# Patient Record
Sex: Male | Born: 1980 | Race: White | Hispanic: No | Marital: Single | Smoking: Never smoker
Health system: Southern US, Community
[De-identification: ages and names within clinical notes are randomized; demographics above are authoritative.]

## PROBLEM LIST (undated history)

## (undated) DIAGNOSIS — F909 Attention-deficit hyperactivity disorder, unspecified type: Secondary | ICD-10-CM

## (undated) DIAGNOSIS — R569 Unspecified convulsions: Secondary | ICD-10-CM

## (undated) DIAGNOSIS — L709 Acne, unspecified: Secondary | ICD-10-CM

## (undated) DIAGNOSIS — F419 Anxiety disorder, unspecified: Secondary | ICD-10-CM

## (undated) DIAGNOSIS — F79 Unspecified intellectual disabilities: Secondary | ICD-10-CM

## (undated) DIAGNOSIS — E559 Vitamin D deficiency, unspecified: Secondary | ICD-10-CM

## (undated) DIAGNOSIS — J302 Other seasonal allergic rhinitis: Secondary | ICD-10-CM

## (undated) DIAGNOSIS — F840 Autistic disorder: Secondary | ICD-10-CM

---
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
## 2006-03-22 ENCOUNTER — Emergency Department (HOSPITAL_COMMUNITY): Admission: EM | Admit: 2006-03-22 | Discharge: 2006-03-22 | Payer: Self-pay | Admitting: Emergency Medicine

## 2007-05-03 ENCOUNTER — Inpatient Hospital Stay (HOSPITAL_COMMUNITY): Admission: EM | Admit: 2007-05-03 | Discharge: 2007-05-05 | Payer: Self-pay | Admitting: Emergency Medicine

## 2011-02-03 NOTE — H&P (Signed)
[name], [name]                ACCOUNT NO.:  [id number]   MEDICAL RECORD NO.:  [id number]          PATIENT TYPE:  INP   LOCATION:  1402                         FACILITY:  [facility]   PHYSICIAN:  Isidor Holts, M.D.  DATE OF BIRTH:  [birth year]   DATE OF ADMISSION:  05/03/2007  DATE OF DISCHARGE:                              HISTORY & PHYSICAL   PRIMARY CARE PHYSICIAN:  Teena Irani. Arlyce Dice, M.D.   CHIEF COMPLAINT:  Seizure episode and fall;  also bumped forehead.   HISTORY OF PRESENT ILLNESS:  This is a 30 year old male.  For past  medical history, see below.  The patient is a resident of [facility] and was unable to furnish history.  However, an attendant  from the group home was in the emergency department, and states that  earlier in the day today, the patient had a seizure episode, fell,  bumped his forehead.  He was subsequently brought to the emergency  department.  Unfortunately, observer accounts are not available and the  patient is unable to contribute history because of autism.  Head CT scan  was done, which suggested possible cerebral contusion.  However,  followup MRI showed no acute pathology.  The patient was, therefore,  referred to the medical service.   PAST MEDICAL HISTORY:  1. Attention deficit disorder.  2. Autism.  3. Mental retardation.  4. Query seizure disorder.   MEDICATIONS:  1. Strattera 40 mg p.o. q.a.m. and 20 mg p.o. at 9 p.m.  2. Paroxetine 20 mg p.o. daily.  3. Minocycline 100 mg p.o. daily.  4. Chlorpheniramine SR 12 mg p.o. b.i.d.  5. Nexium 40 mg p.o. daily.  6. Lorazepam 0.5 mg p.o. q.i.d. and 2 mg p.o. p.r.n.Marland Kitchen   ALLERGIES:  NO KNOWN DRUG ALLERGIES.   REVIEW OF SYSTEMS:  Unobtainable.  However, no report of abdominal pain,  vomiting, diarrhea, fever, chills, cough or shortness of breath.   SOCIAL HISTORY:  The patient is a resident of [facility] where  he has resided for years.  Nonsmoker, nondrinker.  No history of  drug  abuse.   FAMILY HISTORY:  Noncontributory.   PHYSICAL EXAMINATION:  VITAL SIGNS:  Temperature 98.1, pulse 91-119 per  minute and regular.  Respiratory rate 18, BP 120/30, pulse oximeter 99%  on room air.  GENERAL:  The patient does not appear to be in obvious acute discomfort  at the time of this evaluation.  Alert, noncommunicative, not short of  breath at rest.  Responds to simple commands.  HEENT:  The patient has a small bruise in the left supraorbital region.  No associated underlying hematoma.  No clinical pallor or jaundice.  No  conjunctival injection.  Throat is clear.  NECK:  Supple.  JVP not seen.  No palpable lymphadenopathy.  No palpable  goiter.  No carotid bruits.  CHEST:  Clinically clear to auscultation.  No wheezes or crackles.  CARDIAC:  Heart sounds S1 and S2 heard normal, regular, no murmurs.  ABDOMEN:  Abdomen is flat, soft and nontender.  No palpable  organomegaly.  No palpable masses.  Normal bowel sounds.  EXTREMITIES:  Lower extremity examination shows no pitting edema,  palpable peripheral pulses.  MUSCULOSKELETAL:  Unremarkable.  CENTRAL NERVOUS SYSTEM:  No focal neurologic deficit on gross  examination.   INVESTIGATIONS:  Electrolytes:  Sodium 141, potassium 3.8, chloride 103,  CO2 30, BUN 5, creatinine 0.74, glucose 112.  Head CT scan dated May 03, 2007 showed small focus of high activation right temporal lobe,  query small hemorrhagic contusion.  MRI was recommended.  Brain MRI  dated May 03, 2007 showed no focal or acute abnormality.   ASSESSMENT AND PLAN:  1. Seizure episode.  The patient has not had any documented seizures      for several months to years, according to group home attendant who      was present in the emergency department.  Certainly review of the      patient's medications indicate no seizure medication at present, so      the diagnosis of seizure disorder appears doubtful.  Unfortunately,      we have no observer  account, and must conclude that this was a      single seizure episode.  We shall admit the patient, observe,      institute seizure and fall precautions, do neuro checks q. 6 a.m.      and do Dilantin and Valproic acid levels just in case the patient      was indeed on seizure medication which were not listed.  The      patient is currently on Strattera.  One of the side effects is      seizures.  We shall, therefore, reduce the patient's Strattera for      now; i.e. hold the nocturnal dose of Strattera and continue only a      40 mg p.o. q.a.m.   1. Blunt head trauma.  This is secondary to fall.  Patient reportedly      bumped his left forehead, but there is no obvious laceration.  He      does have a small bruise in the left supraorbital region.  Head CT      scan suggested contusion, but this was not confirmed on brain MRI.      We shall, therefore, observe only for now and utilize p.r.n.      Tylenol.   1. History of attention deficit disorder.  We shall continue a.m.      Strattera as mentioned above, and hold p.m. Strattera for now.   Note we shall request neurology consult in a.m. of May 04, 2007.   Further management will depend on clinical course.      Isidor Holts, M.D.  Electronically Signed    CO/MEDQ  D:  05/03/2007  T:  05/05/2007  Job:  [id number]   cc:   Teena Irani. Arlyce Dice, M.D.  Fax: [phone]

## 2011-02-06 NOTE — Discharge Summary (Signed)
[name], [name]                ACCOUNT NO.:  [id number]   MEDICAL RECORD NO.:  [id number]          PATIENT TYPE:  INP   LOCATION:  1402                         FACILITY:  [facility]   PHYSICIAN:  Lonia Blood, M.D.      DATE OF BIRTH:  02-20-1981   DATE OF ADMISSION:  05/03/2007  DATE OF DISCHARGE:  05/05/2007                               DISCHARGE SUMMARY   PRIMARY CARE PHYSICIAN:  The patient was unassigned to [place].   DISCHARGE DIAGNOSES:  1. Questionable seizure disorder.  2. Mental retardations.  3. Attention deficit disorder.  4. History of head trauma.Marland Kitchen   DISCHARGE MEDICATIONS:  1. Strattera 40 mg daily.  2. Paroxetine 20 mg daily.  3. Minocycline 100 mg daily.  4. Chlorpheniramine 12 mg twice a day.  5. Nexium 40 mg daily.  6. Strattera again 20 mg in the evening.  7. Ativan 2 mg as needed and Ativan 0.5 mg 4 times a day.   DISPOSITION:  The patient was discharged back to a skilled facility with  no change in his medication. No seizure observed in hospital.   PROCEDURES PERFORMED:  None.   BRIEF HISTORY AND PHYSICAL:  Please refer to dictated history and  physical by Dr. Isidor Holts. In short however, this is a 30 year old  gentleman with known history of autism, ADD and mental retardation and  was brought in secondary to patient having a seizure episode.  He had  no prior history of seizure disorder and was admitted mainly for  observation and further treatment.   HOSPITAL COURSE:  Possible seizure.  The patient was admitted, observed  closely in the hospital and no seizure episode was observed.  He was  continued on his home regimen of medications.  Throughout the 2 days in  the hospital and at his baseline.  Subsequently it was not clear what  really happened, whether the patient had a true seizure disorder or not.  His primary care physician is Dr. Dara Hoyer of note.  Hence, we will  communicate with  Dr. Arlyce Dice that further management should be done from  the group home.  In case the patient showed any more signs of seizure then will start  some antiseizure medications.  Otherwise the rest of his medical issues  are stable and no further treatment was required.      Lonia Blood, M.D.  Electronically Signed     LG/MEDQ  D:  06/08/2007  T:  06/08/2007  Job:  [id number]

## 2011-03-03 ENCOUNTER — Emergency Department (HOSPITAL_BASED_OUTPATIENT_CLINIC_OR_DEPARTMENT_OTHER)
Admission: EM | Admit: 2011-03-03 | Discharge: 2011-03-03 | Disposition: A | Discharge status: Home / Self Care | Payer: Medicaid Other | Attending: Emergency Medicine | Admitting: Emergency Medicine

## 2011-03-03 DIAGNOSIS — S0180XA Unspecified open wound of other part of head, initial encounter: Secondary | ICD-10-CM | POA: Insufficient documentation

## 2011-03-03 DIAGNOSIS — F988 Other specified behavioral and emotional disorders with onset usually occurring in childhood and adolescence: Secondary | ICD-10-CM | POA: Insufficient documentation

## 2011-03-03 DIAGNOSIS — Y9210 Unspecified residential institution as the place of occurrence of the external cause: Secondary | ICD-10-CM | POA: Insufficient documentation

## 2011-03-03 DIAGNOSIS — F79 Unspecified intellectual disabilities: Secondary | ICD-10-CM | POA: Insufficient documentation

## 2011-03-03 DIAGNOSIS — X58XXXA Exposure to other specified factors, initial encounter: Secondary | ICD-10-CM | POA: Insufficient documentation

## 2011-06-03 DIAGNOSIS — F419 Anxiety disorder, unspecified: Secondary | ICD-10-CM | POA: Insufficient documentation

## 2011-06-03 DIAGNOSIS — Z9109 Other allergy status, other than to drugs and biological substances: Secondary | ICD-10-CM | POA: Insufficient documentation

## 2011-07-06 LAB — BASIC METABOLIC PANEL
BUN: 5 — ABNORMAL LOW
CO2: 29
CO2: 30
Calcium: 9
Calcium: 9.4
Chloride: 103
Chloride: 105
Creatinine, Ser: 0.74
GFR calc Af Amer: >60
GFR calc Af Amer: >60
GFR calc non Af Amer: >60
Glucose, Bld: 112 — ABNORMAL HIGH
Potassium: 3.8
Sodium: 140
Sodium: 141

## 2011-07-06 LAB — PHENYTOIN LEVEL, TOTAL: Phenytoin Lvl: <2.5 — ABNORMAL LOW

## 2011-07-06 LAB — CBC
Hemoglobin: 15.5
MCHC: 34.3
RBC: 5
WBC: 7.4

## 2011-07-06 LAB — VALPROIC ACID LEVEL: Valproic Acid Lvl: <10 — ABNORMAL LOW

## 2013-07-05 ENCOUNTER — Other Ambulatory Visit: Payer: Self-pay | Admitting: Dentistry

## 2013-07-10 ENCOUNTER — Encounter (HOSPITAL_COMMUNITY): Payer: Self-pay | Admitting: Pharmacy Technician

## 2013-07-12 ENCOUNTER — Encounter (HOSPITAL_COMMUNITY)
Admission: RE | Admit: 2013-07-12 | Discharge: 2013-07-12 | Disposition: A | Discharge status: Home / Self Care | Payer: Medicaid Other | Source: Ambulatory Visit | Attending: Dentistry | Admitting: Dentistry

## 2013-07-12 ENCOUNTER — Encounter (HOSPITAL_COMMUNITY): Payer: Self-pay

## 2013-07-12 DIAGNOSIS — Z01818 Encounter for other preprocedural examination: Secondary | ICD-10-CM | POA: Insufficient documentation

## 2013-07-12 HISTORY — DX: Autistic disorder: F84.0

## 2013-07-12 HISTORY — DX: Unspecified intellectual disabilities: F79

## 2013-07-12 HISTORY — DX: Anxiety disorder, unspecified: F41.9

## 2013-07-12 HISTORY — DX: Attention-deficit hyperactivity disorder, unspecified type: F90.9

## 2013-07-12 NOTE — Pre-Procedure Instructions (Signed)
Blue Winther  07/12/2013   Your procedure is scheduled on:  Tuesday, July 18, 2013  Report to [facility] Short Stay (use Main Entrance "A'') at 7:30AM.  Call this number if you have problems the morning of surgery: [phone]   Remember:   Do not eat food or drink liquids after midnight.   Take these medicines the morning of surgery with A SIP OF WATER:atomoxetine (STRATTERA) 40 MG capsule, LORazepam (ATIVAN), paroxetine mesylate (PEXEVA) 20 MG tablet   Do not wear jewelry.  Do not wear lotions, powders, or colognes. You may wear deodorant.  Men may shave face and neck.  Do not bring valuables to the hospital.  [facility] is not responsible  for any belongings or valuables.               Contacts, dentures or bridgework may not be worn into surgery.  Leave suitcase in the car. After surgery it may be brought to your room.  For patients admitted to the hospital, discharge time is determined by your treatment team.               Patients discharged the day of surgery will not be allowed to drive home.  Name and phone number of your driver:   Special Instructions: Shower using CHG 2 nights before surgery and the night before surgery.  If you shower the day of surgery use CHG.  Use special wash - you have one bottle of CHG for all showers.  You should use approximately 1/3 of the bottle for each shower.   Please read over the following fact sheets that you were given: Pain Booklet and Surgical Site Infection Prevention

## 2013-07-12 NOTE — Progress Notes (Signed)
Nurse called and spoke with [name] at Dr. Janyce Llanos office regarding the consent form. Nurse informed her that it was a consent form in EPIC however it had already been released and Nurse was unable to find consent. [name] stated she already placed orders in [facility] and that the patient should have a consent form and a H&P with them at PAT. Will check with patient.

## 2013-07-12 NOTE — Progress Notes (Signed)
Patient is from [facility] ([facility] [phone]) in [place] and was assisted today by Lysbeth Penner. Lysbeth Penner was unable to provide Nurse with any medical history on patient. Nurse called patients mother Ivory Broad at [phone] and questioned her about any past surgical history and she stated that patient had surgery on his arm after falling off a bed, however she was unsure which arm he had surgery on. When Nurse asked Ivory Broad if she had guardian ship paperwork she stated she had it but was unsure as to where it was. She said patient had a case Production designer, theatre/television/film at [facility] in [place] however she does not know the name of the case manager as she has not been in contact with them in years. Nurse also called [employer] and requested that guardianship papers be faxed to PAT. Nurse also called  Chales Abrahams at Dr. Cathlean Marseilles office [phone] and questioned if she had any guardian ship papers on file. She stated she would check and fax them if she did. Revonda Standard, [place] consulted and made aware of situation and stated no lab work would be needed at this time.

## 2013-07-12 NOTE — Progress Notes (Signed)
Guardian ship papers placed on chart.

## 2013-07-13 NOTE — Progress Notes (Signed)
Anesthesia Chart Review:  Patient is a 32 year old male scheduled for dental restoration, xrays, extractions on 07/18/13 by Dr. Martie Round.  History includes ADHD, anxiety, autism, mental retardation, non-smoker.  He resides at [facility] in [place].  Based on his history, I did not think that he would need EKG, CXR, or labs preoperatively.  He will be further evaluated by his assigned anesthesiologist on the day of surgery.  If labs felt warranted at that time, then a Hemocue or ISTAT can be considered.  Velna Ochs [facility] Stay Center/Anesthesiology Phone [phone] 07/13/2013 11:09 AM

## 2013-07-13 NOTE — Progress Notes (Signed)
Spoke with pt's mother, Ivory Broad, to verify that she signed the consent form for pt's up coming surgery for 07/18/13. Stated she did sign the consent and her last name is Fayrene Fearing, although on the  guardianship papers state brenda barren.

## 2013-07-18 ENCOUNTER — Encounter (HOSPITAL_COMMUNITY): Payer: Self-pay | Admitting: Anesthesiology

## 2013-07-18 ENCOUNTER — Encounter (HOSPITAL_COMMUNITY)
Admission: RE | Disposition: A | Discharge status: Home / Self Care | Payer: Self-pay | Source: Ambulatory Visit | Attending: Dentistry

## 2013-07-18 ENCOUNTER — Encounter (HOSPITAL_COMMUNITY): Payer: Medicaid Other | Admitting: Vascular Surgery

## 2013-07-18 ENCOUNTER — Ambulatory Visit (HOSPITAL_COMMUNITY)
Admission: RE | Admit: 2013-07-18 | Discharge: 2013-07-18 | Disposition: A | Discharge status: Home / Self Care | Payer: Medicaid Other | Source: Ambulatory Visit | Attending: Dentistry | Admitting: Dentistry

## 2013-07-18 ENCOUNTER — Ambulatory Visit (HOSPITAL_COMMUNITY): Payer: Medicaid Other | Admitting: Anesthesiology

## 2013-07-18 DIAGNOSIS — F840 Autistic disorder: Secondary | ICD-10-CM | POA: Insufficient documentation

## 2013-07-18 DIAGNOSIS — K029 Dental caries, unspecified: Secondary | ICD-10-CM | POA: Insufficient documentation

## 2013-07-18 DIAGNOSIS — F79 Unspecified intellectual disabilities: Secondary | ICD-10-CM | POA: Insufficient documentation

## 2013-07-18 HISTORY — PX: DENTAL RESTORATION/EXTRACTION WITH X-RAY: SHX5796

## 2013-07-18 SURGERY — DENTAL RESTORATION/EXTRACTION WITH X-RAY
Anesthesia: General | Site: Mouth | Wound class: Clean Contaminated

## 2013-07-18 MED ORDER — PROPOFOL 10 MG/ML IV BOLUS
INTRAVENOUS | Status: DC | PRN
  Administered 2013-07-18: 200 mg via INTRAVENOUS

## 2013-07-18 MED ORDER — LIDOCAINE-EPINEPHRINE 2 %-1:100000 IJ SOLN
INTRAMUSCULAR | Status: DC | PRN
  Administered 2013-07-18: 8 mL

## 2013-07-18 MED ORDER — DEXAMETHASONE SODIUM PHOSPHATE 4 MG/ML IJ SOLN
INTRAMUSCULAR | Status: DC | PRN
  Administered 2013-07-18: 8 mg via INTRAVENOUS

## 2013-07-18 MED ORDER — ONDANSETRON HCL 4 MG/2ML IJ SOLN
INTRAMUSCULAR | Status: DC | PRN
  Administered 2013-07-18: 4 mg via INTRAVENOUS

## 2013-07-18 MED ORDER — HEMOSTATIC AGENTS (NO CHARGE) OPTIME
TOPICAL | Status: DC | PRN
  Administered 2013-07-18: 1 via TOPICAL

## 2013-07-18 MED ORDER — LIDOCAINE HCL (CARDIAC) 20 MG/ML IV SOLN
INTRAVENOUS | Status: DC | PRN
  Administered 2013-07-18: 60 mg via INTRAVENOUS

## 2013-07-18 MED ORDER — LACTATED RINGERS IV SOLN
INTRAVENOUS | Status: DC
  Administered 2013-07-18: 08:00:00 via INTRAVENOUS

## 2013-07-18 MED ORDER — 0.9 % SODIUM CHLORIDE (POUR BTL) OPTIME
TOPICAL | Status: DC | PRN
  Administered 2013-07-18: 1000 mL

## 2013-07-18 MED ORDER — SUCCINYLCHOLINE CHLORIDE 20 MG/ML IJ SOLN
INTRAMUSCULAR | Status: DC | PRN
  Administered 2013-07-18: 100 mg via INTRAVENOUS

## 2013-07-18 MED ORDER — FENTANYL CITRATE 0.05 MG/ML IJ SOLN
INTRAMUSCULAR | Status: DC | PRN
  Administered 2013-07-18 (×2): 50 ug via INTRAVENOUS
  Administered 2013-07-18: 100 ug via INTRAVENOUS
  Administered 2013-07-18: 50 ug via INTRAVENOUS

## 2013-07-18 MED ORDER — LACTATED RINGERS IV SOLN
INTRAVENOUS | Status: DC | PRN
  Administered 2013-07-18 (×2): via INTRAVENOUS

## 2013-07-18 MED ORDER — MIDAZOLAM HCL 5 MG/5ML IJ SOLN
INTRAMUSCULAR | Status: DC | PRN
  Administered 2013-07-18: 2 mg via INTRAVENOUS

## 2013-07-18 MED ORDER — CEFAZOLIN SODIUM-DEXTROSE 2-3 GM-% IV SOLR
INTRAVENOUS | Status: DC | PRN
  Administered 2013-07-18: 2 g via INTRAVENOUS

## 2013-07-18 SURGICAL SUPPLY — 22 items
CANISTER SUCTION 2500CC (MISCELLANEOUS) ×2 IMPLANT
COUNTER NEEDLE 20 DBL MAG RED (NEEDLE) ×1 IMPLANT
COVER PROBE W GEL 5X96 (DRAPES) ×1 IMPLANT
COVER SURGICAL LIGHT HANDLE (MISCELLANEOUS) ×2 IMPLANT
COVER TABLE BACK 60X90 (DRAPES) ×2 IMPLANT
DRAPE PROXIMA HALF (DRAPES) ×1 IMPLANT
GAUZE PACKING FOLDED 2  STR (GAUZE/BANDAGES/DRESSINGS) ×1
GAUZE PACKING FOLDED 2 STR (GAUZE/BANDAGES/DRESSINGS) IMPLANT
GAUZE SPONGE 4X4 16PLY XRAY LF (GAUZE/BANDAGES/DRESSINGS) ×3 IMPLANT
GLOVE BIO SURGEON STRL SZ7.5 (GLOVE) ×3 IMPLANT
GOWN STRL NON-REIN LRG LVL3 (GOWN DISPOSABLE) ×4 IMPLANT
KIT BASIN OR (CUSTOM PROCEDURE TRAY) ×2 IMPLANT
KIT ROOM TURNOVER OR (KITS) ×2 IMPLANT
PAD ARMBOARD 7.5X6 YLW CONV (MISCELLANEOUS) ×4 IMPLANT
SPONGE SURGIFOAM ABS GEL 12-7 (HEMOSTASIS) ×1 IMPLANT
SPONGE SURGIFOAM ABS GEL SZ50 (HEMOSTASIS) ×1 IMPLANT
SUT CHROMIC 3 0 PS 2 (SUTURE) ×2 IMPLANT
SYR CONTROL 10ML LL (SYRINGE) ×1 IMPLANT
TOOTHBRUSH ADULT (PERSONAL CARE ITEMS) ×1 IMPLANT
TOWEL OR 17X26 10 PK STRL BLUE (TOWEL DISPOSABLE) ×2 IMPLANT
TUBE CONNECTING 12X1/4 (SUCTIONS) ×2 IMPLANT
YANKAUER SUCT BULB TIP NO VENT (SUCTIONS) ×2 IMPLANT

## 2013-07-18 NOTE — Transfer of Care (Signed)
Immediate Anesthesia Transfer of Care Note  Patient: [name]  Procedure(s) Performed: Procedure(s) with comments: CLEANING WITH XRAYS DENTAL RESTORATION AND EXTRACTION  (N/A) - Recall exam, Full mouth x-rays, Extraction teeth numbers 5, 6, 7,17,21,22,27 Glass Ionomer #26 (D,F)   Patient Location: PACU  Anesthesia Type:General  Level of Consciousness: awake and alert   Airway & Oxygen Therapy: Patient Spontanous Breathing  Post-op Assessment: Report given to PACU RN, Post -op Vital signs reviewed and stable and Patient moving all extremities  Post vital signs: Reviewed and stable  Complications: No apparent anesthesia complications

## 2013-07-18 NOTE — Anesthesia Postprocedure Evaluation (Signed)
  Anesthesia Post-op Note  Patient: [name]  Procedure(s) Performed: Procedure(s) (LRB): CLEANING WITH XRAYS DENTAL RESTORATION AND EXTRACTION  (N/A)  Patient Location: PACU  Anesthesia Type: General  Level of Consciousness: awake and alert   Airway and Oxygen Therapy: Patient Spontanous Breathing  Post-op Pain: mild  Post-op Assessment: Post-op Vital signs reviewed, Patient's Cardiovascular Status Stable, Respiratory Function Stable, Patent Airway and No signs of Nausea or vomiting  Last Vitals:  Filed Vitals:   07/18/13 1230  BP: 129/72  Pulse: 103  Temp:   Resp: 16    Post-op Vital Signs: stable   Complications: No apparent anesthesia complications

## 2013-07-18 NOTE — Op Note (Signed)
Recall Exam, FMX, FMD, Extractions #5,6,7,17,21,22,27 with 8 cc 2% Lidocaine 1:100000 epi, gel foam, 3-0 chromic suture closure, Glass Ionomer #26 Surgery drugs: 30 mg Toradol, 8 mg Decadron, 1 gram Ancef

## 2013-07-18 NOTE — Anesthesia Preprocedure Evaluation (Addendum)
Anesthesia Evaluation  Patient identified by MRN, date of birth, ID band Patient awake    Reviewed: Allergy & Precautions, H&P , NPO status , Patient's Chart, lab work & pertinent test results  Airway Mallampati: II TM Distance: >3 FB Neck ROM: full    Dental  (+) Poor Dentition and Dental Advisory Given   Pulmonary neg pulmonary ROS,  breath sounds clear to auscultation  Pulmonary exam normal       Cardiovascular Exercise Tolerance: Good negative cardio ROS  Rhythm:regular Rate:Normal     Neuro/Psych MR, autism negative neurological ROS  negative psych ROS   GI/Hepatic negative GI ROS, Neg liver ROS,   Endo/Other  negative endocrine ROS  Renal/GU negative Renal ROS  negative genitourinary   Musculoskeletal   Abdominal   Peds  Hematology negative hematology ROS (+)   Anesthesia Other Findings   Reproductive/Obstetrics negative OB ROS                          Anesthesia Physical Anesthesia Plan  ASA: III  Anesthesia Plan: General   Post-op Pain Management:    Induction: Intravenous  Airway Management Planned: Nasal ETT  Additional Equipment:   Intra-op Plan:   Post-operative Plan: Extubation in OR  Informed Consent: I have reviewed the patients History and Physical, chart, labs and discussed the procedure including the risks, benefits and alternatives for the proposed anesthesia with the patient or authorized representative who has indicated his/her understanding and acceptance.   Dental Advisory Given  Plan Discussed with: CRNA and Surgeon  Anesthesia Plan Comments:         Anesthesia Quick Evaluation

## 2013-07-18 NOTE — H&P (Signed)
  Approved for surgery. 

## 2013-07-18 NOTE — Brief Op Note (Signed)
07/18/2013  12:06 PM  PATIENT:  Quitman Livings  32 y.o. male  PRE-OPERATIVE DIAGNOSIS:  DENTAL CARIES  POST-OPERATIVE DIAGNOSIS:  DENTAL CARIES  PROCEDURE:  Procedure(s) with comments: CLEANING WITH XRAYS DENTAL RESTORATION AND EXTRACTION  (N/A) - Recall exam, Full mouth x-rays, Extraction teeth numbers 5, 6, 7,17,21,22,27 Glass Ionomer #26 (D,F) FMD  SURGEON:  Surgeon(s) and Role:    * Esaw Dace., DDS - Primary  PHYSICIAN ASSISTANT:   ASSISTANTS: Laqueta Carina   ANESTHESIA:   general  EBL:  Total I/O In: 1000 [I.V.:1000] Out: 200 [Blood:200]  BLOOD ADMINISTERED:none  DRAINS: none   LOCAL MEDICATIONS USED:  LIDOCAINE   SPECIMEN:  No Specimen  DISPOSITION OF SPECIMEN:  N/A  COUNTS:  YES  TOURNIQUET:  * No tourniquets in log *  DICTATION: .Note written in EPIC  PLAN OF CARE: Discharge to home after PACU  PATIENT DISPOSITION:  PACU - hemodynamically stable.   Delay start of Pharmacological VTE agent (>24hrs) due to surgical blood loss or risk of bleeding: not applicable

## 2013-07-18 NOTE — Anesthesia Procedure Notes (Signed)
Procedure Name: Intubation Date/Time: 07/18/2013 9:38 AM Performed by: Ferol Luz L Pre-anesthesia Checklist: Patient identified, Emergency Drugs available, Suction available, Patient being monitored and Timeout performed Patient Re-evaluated:Patient Re-evaluated prior to inductionOxygen Delivery Method: Circle system utilized Preoxygenation: Pre-oxygenation with 100% oxygen Intubation Type: IV induction Ventilation: Mask ventilation without difficulty Laryngoscope Size: Mac and 3 Grade View: Grade II Nasal Tubes: Right and Nasal Rae Tube size: 7.5 mm Number of attempts: 1 Placement Confirmation: ETT inserted through vocal cords under direct vision,  breath sounds checked- equal and bilateral and positive ETCO2 Tube secured with: Tape Dental Injury: Teeth and Oropharynx as per pre-operative assessment  Comments: Smooth iv induction, right nare serially dilated with NPA's 7-7.5, 7.5 nasal rae passed s trauma under dv with mac #3 through cords.

## 2013-07-18 NOTE — Progress Notes (Signed)
Dr. Martie Round at beside, talked to caregiver.

## 2013-07-19 ENCOUNTER — Encounter (HOSPITAL_COMMUNITY): Payer: Self-pay | Admitting: Dentistry

## 2013-07-20 NOTE — Op Note (Signed)
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  Dental care provided in the OR for medically necessary treatment.  OPERATIONS:  Full mouth oral rehabilitation including exam, x-rays, cleaning, extractions.  OPERATIVE SURGEON:  Azucena Freed, DDS  ASSISTANTS:  Laqueta Carina and hospital staff.  ANESTHESIA:  General.  DESCRIPTION OF PROCEDURE:  The patient was brought into the operating room and placed in the supine position.  General anesthesia was administered via nasal intubation.  The patient was prepped and draped in usual manner for an intraoral general dentistry procedure.  The oropharynx was suctioned and moistened.  Posterior throat pack was placed.  A full intraoral exam including all hard and soft tissues was performed.  This was a recall exam.  Soft tissue exam reveals the floor of the mouth, buccal mucosa, soft palate, hard palate, tongue, gingival and frenum attachments all within normal limits with regard to size, color, and consistency.  The hard tissue exam reveals #1 and #2 present, #3 missing, #4 through #9 present, #10 and #11 missing, #12 and #13 present, #14 missing, #15 present, #16 missing, #17 through #20 present, #21 present with class III mobility, #22 present with class III mobility, #23 through #26 present, #27 class III mobility, #28 through #31 present, #32 missing.  A full mouth series of digital x-rays was completed and a full mouth debridement was done.  Operative care was provided with a high-speed handpiece #557 and a #4 burr and with a  slow speed handpiece #4 burr.  A glass ionomer was completed on tooth #26 DL. Extractions were completed on #5, #6, #7, #17, #21, #22, and #27. Gelfoam was placed in the extraction sites.  A 3-0 chromic suture was used to close the extraction sites.  8 mL of 2% lidocaine with 1:100,000 epinephrine was given.  Estimated blood loss of 30 mL.  Surgery drugs used 30 mg Toradol for post-extraction pain, 1 g of Ancef IV for post- extraction infection, and 8 mg of Decadron for post-extraction swelling. The mouth was suctioned dried and a posterior throat pack was carefully removed with constant suction.  The patient was awakened in the OR and transferred to the recovery room in good condition.  An extraction sheet was given to the patient's representative.  Post procedure prescriptions; amoxicillin 500 mg 1 t.i.d. x7 days for infection and Vicodin 5 mg/325 mg, 16 tabs, one q.4 hours p.r.n. pain.     Esaw Dace., D.D.S.     WEM/MEDQ  D:  07/19/2013  T:  07/20/2013  Job:  [id number]

## 2015-04-25 ENCOUNTER — Encounter (HOSPITAL_COMMUNITY): Payer: Self-pay | Admitting: Emergency Medicine

## 2015-04-25 ENCOUNTER — Emergency Department (HOSPITAL_COMMUNITY): Payer: Medicaid Other

## 2015-04-25 ENCOUNTER — Emergency Department (HOSPITAL_COMMUNITY)
Admission: EM | Admit: 2015-04-25 | Discharge: 2015-04-25 | Disposition: A | Discharge status: Home / Self Care | Payer: Medicaid Other | Attending: Emergency Medicine | Admitting: Emergency Medicine

## 2015-04-25 DIAGNOSIS — F840 Autistic disorder: Secondary | ICD-10-CM | POA: Diagnosis not present

## 2015-04-25 DIAGNOSIS — F419 Anxiety disorder, unspecified: Secondary | ICD-10-CM | POA: Diagnosis not present

## 2015-04-25 DIAGNOSIS — Z79899 Other long term (current) drug therapy: Secondary | ICD-10-CM | POA: Insufficient documentation

## 2015-04-25 DIAGNOSIS — R569 Unspecified convulsions: Secondary | ICD-10-CM

## 2015-04-25 HISTORY — DX: Unspecified convulsions: R56.9

## 2015-04-25 LAB — BASIC METABOLIC PANEL
Anion gap: 10 (ref 5–15)
BUN: 7 mg/dL (ref 6–20)
CHLORIDE: 106 mmol/L (ref 101–111)
CO2: 22 mmol/L (ref 22–32)
CREATININE: 0.85 mg/dL (ref 0.61–1.24)
Calcium: 9.2 mg/dL (ref 8.9–10.3)
GFR calc non Af Amer: >60 mL/min (ref >60)
Glucose, Bld: 186 mg/dL — ABNORMAL HIGH (ref 65–99)
Potassium: 4.2 mmol/L (ref 3.5–5.1)
SODIUM: 138 mmol/L (ref 135–145)

## 2015-04-25 LAB — URINE MICROSCOPIC-ADD ON

## 2015-04-25 LAB — CBC WITH DIFFERENTIAL/PLATELET
BASOS PCT: 1 % (ref 0–1)
Basophils Absolute: 0 10*3/uL (ref 0.0–0.1)
EOS ABS: 0.1 10*3/uL (ref 0.0–0.7)
Eosinophils Relative: 2 % (ref 0–5)
HCT: 49.2 % (ref 39.0–52.0)
Hemoglobin: 16.8 g/dL (ref 13.0–17.0)
LYMPHS PCT: 34 % (ref 12–46)
Lymphs Abs: 1.5 10*3/uL (ref 0.7–4.0)
MCH: 31.1 pg (ref 26.0–34.0)
MCHC: 34.1 g/dL (ref 30.0–36.0)
MCV: 90.9 fL (ref 78.0–100.0)
MONOS PCT: 4 % (ref 3–12)
Monocytes Absolute: 0.2 10*3/uL (ref 0.1–1.0)
Neutro Abs: 2.6 10*3/uL (ref 1.7–7.7)
Neutrophils Relative %: 59 % (ref 43–77)
PLATELETS: 199 10*3/uL (ref 150–400)
RBC: 5.41 MIL/uL (ref 4.22–5.81)
RDW: 13.6 % (ref 11.5–15.5)
WBC: 4.3 10*3/uL (ref 4.0–10.5)

## 2015-04-25 LAB — URINALYSIS, ROUTINE W REFLEX MICROSCOPIC
Bilirubin Urine: NEGATIVE
GLUCOSE, UA: NEGATIVE mg/dL
Ketones, ur: NEGATIVE mg/dL
Leukocytes, UA: NEGATIVE
Nitrite: NEGATIVE
PROTEIN: NEGATIVE mg/dL
Specific Gravity, Urine: 1.014 (ref 1.005–1.030)
UROBILINOGEN UA: 0.2 mg/dL (ref 0.0–1.0)
pH: 6 (ref 5.0–8.0)

## 2015-04-25 MED ORDER — LORAZEPAM 1 MG PO TABS
0.5000 mg | ORAL_TABLET | Freq: Once | ORAL | Status: AC
  Administered 2015-04-25: 0.5 mg via ORAL
  Filled 2015-04-25: qty 1

## 2015-04-25 NOTE — ED Notes (Addendum)
Onset today witnessed seizure at a group home approximately 3 minutes sitting on couch mouth foaming, entire body tense and shaking.  Group home member at bedside states 4-5 minutes to become alert to his normal. Member stated history of seizures in the past however has not had a seizure for years.   EMS administered zofran  IVP emesis x1 en route.

## 2015-04-25 NOTE — ED Provider Notes (Signed)
CSN: [id number]     Arrival date & time 04/25/15  0916 History   First MD Initiated Contact with Patient 04/25/15 [id number]     Chief Complaint  Patient presents with  . Seizures     (Consider location/radiation/quality/duration/timing/severity/associated sxs/prior Treatment) HPI  34 year old male with a history of MR and autism presents from his group home after a seizure episode. The history is taken from the group home staff member at the bedside as the patient is nonverbal. The patient had finished eating about 30 to his prior was sitting on the couch when all of a sudden he started to shake in all 4 extremities. Patient had drooling/foaming and was shaking for about 3 minutes. No reported injuries. Took around 4-5 minutes to return fully to baseline after the shaking stopped. Patient is now at his mental baseline. Group member states that he has not been sick or unwell except that this morning he seemed a little more out of it than normal. Patient has not had any reported fevers or head injuries recently. Now is acting at his baseline. A few years ago he had a seizure or at least a seizure history but is not currently on medicine.  Past Medical History  Diagnosis Date  . ADHD (attention deficit hyperactivity disorder)   . Anxiety   . Autism   . Mental retardation    Past Surgical History  Procedure Laterality Date  . Dental restoration/extraction with x-ray N/A 07/18/2013    Procedure: CLEANING WITH XRAYS DENTAL RESTORATION AND EXTRACTION ;  Surgeon: Esaw Dace., DDS;  Location: [facility] OR;  Service: Oral Surgery;  Laterality: N/A;  Recall exam, Full mouth x-rays, Extraction teeth numbers 5, 6, 7,17,21,22,27 Glass Ionomer #26 (D,F)    No family history on file. History  Substance Use Topics  . Smoking status: Never Smoker   . Smokeless tobacco: Not on file  . Alcohol Use: No    Review of Systems  Unable to perform ROS: Patient nonverbal      Allergies  Review of patient's  allergies indicates no known allergies.  Home Medications   Prior to Admission medications   Medication Sig Start Date End Date Taking? Authorizing Provider  atomoxetine (STRATTERA) 40 MG capsule Take 40 mg by mouth 2 (two) times daily.    Historical Provider, MD  carbamide peroxide (DEBROX) 6.5 % otic solution Place 5 drops into both ears 2 (two) times a week. For cerumen impaction    Historical Provider, MD  cholecalciferol (VITAMIN D) 1000 UNITS tablet Take 1,000 Units by mouth daily.    Historical Provider, MD  LORazepam (ATIVAN) 0.5 MG tablet Take 0.5 mg by mouth 4 (four) times daily.    Historical Provider, MD  LORazepam (ATIVAN) 2 MG tablet Take 4 mg by mouth as needed for anxiety (Take 2 tablets prior to haircut).    Historical Provider, MD  LORazepam (ATIVAN) 2 MG tablet Take 2 mg by mouth as needed (prior to dental appointment).    Historical Provider, MD  paroxetine mesylate (PEXEVA) 20 MG tablet Take 20 mg by mouth daily.    Historical Provider, MD  Prenatal Vit-Sel-Fe Fum-FA (VINATE M PO) Take 1 tablet by mouth daily.    Historical Provider, MD   There were no vitals taken for this visit. Physical Exam  Constitutional: He appears well-developed and well-nourished.  HENT:  Head: Normocephalic and atraumatic.  Right Ear: External ear normal.  Left Ear: External ear normal.  Nose: Nose normal.  patient does  not open mouth and holds it closed, unable to see oropharynx.   Eyes: Pupils are equal, round, and reactive to light. Right eye exhibits no discharge. Left eye exhibits no discharge.  Neck: Neck supple.  No stiffness appreciated  Cardiovascular: Normal rate, regular rhythm, normal heart sounds and intact distal pulses.   Pulmonary/Chest: Effort normal.  Abdominal: Soft. There is no tenderness.  Musculoskeletal: He exhibits no edema.  Neurological: He is alert. GCS eye subscore is 4. GCS verbal subscore is 1. GCS motor subscore is 5.  Patient is nonverbal. Grossly moves  all 4 extremities but not on command. Tone is normal. Does not follow commands well enough for CN testing  Skin: Skin is warm and dry.  Nursing note and vitals reviewed.   ED Course  Procedures (including critical care time) Labs Review Labs Reviewed  BASIC METABOLIC PANEL - Abnormal; Notable for the following:    Glucose, Bld 186 (*)    All other components within normal limits  URINALYSIS, ROUTINE W REFLEX MICROSCOPIC (NOT AT [facility]) - Abnormal; Notable for the following:    Hgb urine dipstick TRACE (*)    All other components within normal limits  CBC WITH DIFFERENTIAL/PLATELET  URINE MICROSCOPIC-ADD ON    Imaging Review Ct Head Wo Contrast  04/25/2015   CLINICAL DATA:  Witnessed seizure at a group home today. History of seizures in the past.  EXAM: CT HEAD WITHOUT CONTRAST  TECHNIQUE: Contiguous axial images were obtained from the base of the skull through the vertex without intravenous contrast.  COMPARISON:  Brain MRI and CT 05/03/2007  FINDINGS: The ventricles and sulci are within normal limits for age. There is no evidence of acute infarct, intracranial hemorrhage, mass, midline shift, or extra-axial collection.  The orbits are unremarkable. There are small bilateral mastoid effusions. No significant paranasal sinus mucosal disease is present. No skull fracture is identified.  IMPRESSION: Unremarkable CT appearance of the brain.   Electronically Signed   By: Sebastian Ache   On: 04/25/2015 10:14     EKG Interpretation   Date/Time:  Thursday April 25 2015 09:20:34 EDT Ventricular Rate:  102 PR Interval:  123 QRS Duration: 91 QT Interval:  351 QTC Calculation: 457 R Axis:   85 Text Interpretation:  Sinus tachycardia Otherwise normal ECG No old  tracing to compare Confirmed by Jacobe Study  MD, Issac Moure ([id number]) on 04/25/2015  9:35:15 AM      MDM   Final diagnoses:  Seizure    Patient's family is now at the bedside and state that he has never had a seizure before to their  knowledge. Patient is acting at his normal baseline and appears to have a normal neuro exam for him. Patient's CT is unremarkable and lab work is unremarkable. No obvious etiology for why he would have a seizure. Is on Ativan daily and has not missed any doses, most recent dose this morning. Given no other seizure activity, will discharge home with PCP and neurology follow-up. Given strict seizure precautions although patient is monitored 24 hours a day at his group home. No signs of meningitis with no neck stiffness, altered mental status for him, or elevated white blood cell count. Stable for discharge home.    Pricilla Loveless, MD 04/25/15 1201

## 2015-04-25 NOTE — ED Notes (Signed)
Seizure pads placed on patient bed,and condom cath size large placed on patient for Urinalysis at [facility] request.

## 2015-04-25 NOTE — ED Notes (Signed)
EDP at bedside ordered a condom catheter.

## 2015-04-25 NOTE — Discharge Instructions (Signed)

## 2015-05-07 ENCOUNTER — Encounter: Payer: Self-pay | Admitting: Neurology

## 2015-05-07 ENCOUNTER — Ambulatory Visit (INDEPENDENT_AMBULATORY_CARE_PROVIDER_SITE_OTHER): Payer: Medicaid Other | Admitting: Neurology

## 2015-05-07 VITALS — BP 110/70 | HR 92 | Resp 16 | Wt 125.0 lb

## 2015-05-07 DIAGNOSIS — G40309 Generalized idiopathic epilepsy and epileptic syndromes, not intractable, without status epilepticus: Secondary | ICD-10-CM

## 2015-05-07 DIAGNOSIS — G9349 Other encephalopathy: Secondary | ICD-10-CM

## 2015-05-07 MED ORDER — DIVALPROEX SODIUM 250 MG PO DR TAB: 250.0000 mg | DELAYED_RELEASE_TABLET | Freq: Two times a day (BID) | ORAL | Status: DC

## 2015-05-07 NOTE — Patient Instructions (Addendum)
1. Bloodwork for LFTs (will need to give the as needed lorazepam dose before blood draw) 2. Start Depakote  twice a day 3. Follow-up in 3 months, call for any problems

## 2015-05-07 NOTE — Progress Notes (Signed)
NEUROLOGY CONSULTATION NOTE  AMBROSIO REUTER MRN: [id number] DOB: 04-Dec-1980  Referring provider: Dr. Antony Haste Primary care provider: Dr. Antony Haste  Reason for consult:  seizure  Dear Dr Cyndia Bent:  Thank you for your kind referral of [name] for consultation of the above symptoms. Although his history is well known to you, please allow me to reiterate it for the purpose of our medical record. The patient was accompanied to the clinic by group home staff who also provides collateral information. Records and images were personally reviewed where available.  HISTORY OF PRESENT ILLNESS: This is a 34 year old man with a history of static encephalopathy with intellectual disability, non-verbal, ADHD, anxiety, presenting for evaluation of seizures. He was in the ER last 04/25/15 after he had a witnessed convulsion at the group home. Group home staff today witnessed the episode, he was sitting on the couch, then suddenly stiffened up and had a convulsion lasting about 3 monutes. It took 4-5 minutes to return to baseline. He was brought to [facility] ER where CBC, BMP, urinalysis were unremarkable.Glucose level 186. Staff today reports he was not eating much that morning, he usually can feed himself but would be messy, but that morning he was just sitting and appeared to be waiting to be fed, which is unusual for him. They deny any recent infections. He was back to baseline in the ER. He had a head CT which I personally reviewed, which did not show any acute changes.   On review of records, he was brought to the ER in August 2008 for report of a seizure. Per ER notes, he had seizure activity with laceration to the left eyebrow. Head CT had shown a small focus of high attenuation in the right temporal lobe, possible focal hemorrhagic contusion. A follow-up MRI brain was significantly degraded by motion, images obtained did not show any acute changes or significant abnormality. It was felt that  seizure history was unclear, and he was not started on anti-seizure medication. According to ER notes, his mother came to the hospital and stated he did not have a seizure before to their knowledge. There is not much history available. According to group home staff, at baseline he is non-verbal, does not consistently follow commands, needing prompting to do things such as walk and sit. He can feed himself but is messy. He needs help with dressing and bathing. He can get agitated and aggressive, hitting and grabbing, and takes Arivan 0.5mg  four times a day for this. Per ER notes, no missed doses.   Laboratory Data:  Lab Results  Component Value Date   WBC 4.3 04/25/2015   HGB 16.8 04/25/2015   HCT 49.2 04/25/2015   MCV 90.9 04/25/2015   PLT 199 04/25/2015     Chemistry      Component Value Date/Time   NA 138 04/25/2015 0947   K 4.2 04/25/2015 0947   CL 106 04/25/2015 0947   CO2 22 04/25/2015 0947   BUN 7 04/25/2015 0947   CREATININE 0.85 04/25/2015 0947      Component Value Date/Time   CALCIUM 9.2 04/25/2015 0947       PAST MEDICAL HISTORY: Past Medical History  Diagnosis Date  . ADHD (attention deficit hyperactivity disorder)   . Anxiety   . Autism   . Mental retardation   . Seizures     PAST SURGICAL HISTORY: Past Surgical History  Procedure Laterality Date  . Dental restoration/extraction with x-ray N/A 07/18/2013  Procedure: CLEANING WITH XRAYS DENTAL RESTORATION AND EXTRACTION ;  Surgeon: Esaw Dace., DDS;  Location: [facility] OR;  Service: Oral Surgery;  Laterality: N/A;  Recall exam, Full mouth x-rays, Extraction teeth numbers 5, 6, 4,09,81,19,14 Glass Ionomer #26 (D,F)     MEDICATIONS: Current Outpatient Prescriptions on File Prior to Visit  Medication Sig Dispense Refill  . atomoxetine (STRATTERA) 40 MG capsule Take 40 mg by mouth 2 (two) times daily.    . carbamide peroxide (DEBROX) 6.5 % otic solution Place 5 drops into both ears 2 (two) times a week.  For cerumen impaction    . cholecalciferol (VITAMIN D) 1000 UNITS tablet Take 1,000 Units by mouth daily.    Marland Kitchen LORazepam (ATIVAN) 0.5 MG tablet Take 0.5 mg by mouth 4 (four) times daily.    . paroxetine mesylate (PEXEVA) 20 MG tablet Take 20 mg by mouth daily.     No current facility-administered medications on file prior to visit.    ALLERGIES: No Known Allergies  FAMILY HISTORY: No family history on file.  SOCIAL HISTORY: Social History   Social History  . Marital Status: Single    Spouse Name: N/A  . Number of Children: N/A  . Years of Education: N/A   Occupational History  . Not on file.   Social History Main Topics  . Smoking status: Never Smoker   . Smokeless tobacco: Not on file  . Alcohol Use: No  . Drug Use: No  . Sexual Activity: Not on file   Other Topics Concern  . Not on file   Social History Narrative    REVIEW OF SYSTEMS unable to obtain due to patient being non-verbal  PHYSICAL EXAM: Filed Vitals:   05/07/15 0903  BP: 110/70  Pulse: 92  Resp: 16   General: No acute distress, sits quietly on chair, does not want to be touched Head:  Normocephalic/atraumatic Eyes: unable to do fundoscopic exam Neck: supple, full range of motion Heart: regular rate and rhythm Lungs: Clear to auscultation bilaterally. Vascular: No carotid bruits. Skin/Extremities: No rash, no edema Neurological Exam: Mental status: alert, non-verbal, does not follow commands Cranial nerves: CN I: not tested CN II: pupils equal, round and reactive to light, +blink to threat bilaterally, unable to do fundoscopic exam CN III, IV, VI:  full range of motion, no nystagmus, no ptosis CN V: unable to test CN VII: upper and lower face symmetric CN VIII: looks when called at times CN IX, X: unable to test CN XII: does not open mouth Bulk & Tone: increased tone, no fasciculations. Motor: unable to do formal muscle testing, appears to move all extremities symmetrically Sensation:  withdraws to touch Deep Tendon Reflexes: brisk +3 on right UE and LE, brisk +2 on left UE and LE; no ankle clonus Plantar responses: unable to test, patient refuses to take shoes off Cerebellar: no incoordination on reaching for objects Gait: narrow-based, no ataxia Tremor: none  IMPRESSION: This is a 33 year old man with a history of static encephalopathy, non-verbal and does not follow commands at baseline, presenting after a witnessed convulsion last 04/25/15. On review of records, he had an ER visit in 2008 for questionable seizure with forehead laceration. Head CT unremarkable. He had an MRI at that time that was degraded by motion but overall no significant abnormalities. He is easily agitated by touch and will not tolerate an EEG. With prior history of possible seizure in 2008 and now another witnessed convulsion, would recommend starting anti-epileptic medication.  He will start low dose Depakote  BID. Side effects discussed with group home staff. This may help with behavior as well. Check LFTs before starting medication. He will follow-up in 3 months, they know to call our office for any changes.   Thank you for allowing me to participate in the care of this patient. Please do not hesitate to call for any questions or concerns.   Patrcia Dolly, M.D.  CC: Dr. Antony Haste

## 2015-05-16 ENCOUNTER — Telehealth: Payer: Self-pay | Admitting: Neurology

## 2015-05-16 NOTE — Telephone Encounter (Signed)
LFTs done 05/09/15 reviewed, normal. AST 17, ALT 20, Alk Phos 97

## 2015-08-08 ENCOUNTER — Ambulatory Visit (INDEPENDENT_AMBULATORY_CARE_PROVIDER_SITE_OTHER): Payer: Medicaid Other | Admitting: Neurology

## 2015-08-08 ENCOUNTER — Encounter: Payer: Self-pay | Admitting: Neurology

## 2015-08-08 VITALS — BP 122/76 | HR 88 | Resp 18 | Wt 128.0 lb

## 2015-08-08 DIAGNOSIS — G9349 Other encephalopathy: Secondary | ICD-10-CM

## 2015-08-08 DIAGNOSIS — G40309 Generalized idiopathic epilepsy and epileptic syndromes, not intractable, without status epilepticus: Secondary | ICD-10-CM

## 2015-08-08 NOTE — Progress Notes (Signed)
NEUROLOGY FOLLOW UP OFFICE NOTE  [name] [id number]  HISTORY OF PRESENT ILLNESS: I had the pleasure of seeing [name] in follow-up in the neurology clinic on 08/08/2015.  The patient was last seen 3 months ago for seizures. He is again accompanied by group home staff who provides history today as [name] is non-verbal and does not follow any commands.  Records and images were personally reviewed where available. LFTs were normal. He had a witnessed seizure on 04/25/15, and previous to that he was brought to the ER in 2008. He does not like to be touched, and would not tolerate an EEG. He was started on Depakote  BID, which he is tolerating with no visible side effects, no drowsiness or behavioral changes. Appetite and sleep are good per staff.   HPI: This is a 34 yo man with a history of static encephalopathy with intellectual disability, non-verbal, ADHD, anxiety, presenting for evaluation of seizures. He was in the ER last 04/25/15 after he had a witnessed convulsion at the group home. Group home staff today witnessed the episode, he was sitting on the couch, then suddenly stiffened up and had a convulsion lasting about 3 minutes. It took 4-5 minutes to return to baseline. He was brought to [facility] ER where CBC, BMP, urinalysis were unremarkable.Glucose level 186. Staff today reports he was not eating much that morning, he usually can feed himself but would be messy, but that morning he was just sitting and appeared to be waiting to be fed, which is unusual for him. They deny any recent infections. He was back to baseline in the ER. He had a head CT which I personally reviewed, which did not show any acute changes.   On review of records, he was brought to the ER in August 2008 for report of a seizure. Per ER notes, he had seizure activity with laceration to the left eyebrow. Head CT had shown a small focus of high attenuation in the right temporal lobe, possible focal hemorrhagic contusion.  A follow-up MRI brain was significantly degraded by motion, images obtained did not show any acute changes or significant abnormality. It was felt that seizure history was unclear, and he was not started on anti-seizure medication. According to ER notes, his mother came to the hospital and stated he did not have a seizure before to their knowledge. There is not much history available. According to group home staff, at baseline he is non-verbal, does not consistently follow commands, needing prompting to do things such as walk and sit. He can feed himself but is messy. He needs help with dressing and bathing. He can get agitated and aggressive, hitting and grabbing, and takes Arivan 0.5mg  four times a day for this. Per ER notes, no missed doses.   PAST MEDICAL HISTORY: Past Medical History  Diagnosis Date  . ADHD (attention deficit hyperactivity disorder)   . Anxiety   . Autism   . Mental retardation   . Seizures (HCC)     MEDICATIONS: Current Outpatient Prescriptions on File Prior to Visit  Medication Sig Dispense Refill  . atomoxetine (STRATTERA) 40 MG capsule Take 40 mg by mouth 2 (two) times daily.    . carbamide peroxide (DEBROX) 6.5 % otic solution Place 5 drops into both ears 2 (two) times a week. For cerumen impaction    . cholecalciferol (VITAMIN D) 1000 UNITS tablet Take 1,000 Units by mouth daily.    . divalproex (DEPAKOTE) 250 MG DR tablet Take 1 tablet (  250 mg total) by mouth 2 (two) times daily. 60 tablet 6  . LORazepam (ATIVAN) 0.5 MG tablet Take 0.5 mg by mouth 4 (four) times daily.    . paroxetine mesylate (PEXEVA) 20 MG tablet Take 20 mg by mouth daily.     No current facility-administered medications on file prior to visit.    ALLERGIES: No Known Allergies  FAMILY HISTORY: No family history on file.  SOCIAL HISTORY: Social History   Social History  . Marital Status: Single    Spouse Name: N/A  . Number of Children: N/A  . Years of Education: N/A    Occupational History  . Not on file.   Social History Main Topics  . Smoking status: Never Smoker   . Smokeless tobacco: Not on file  . Alcohol Use: No  . Drug Use: No  . Sexual Activity: Not on file   Other Topics Concern  . Not on file   Social History Narrative    REVIEW OF SYSTEMS: unable to obtain due to patient being non-verbal  PHYSICAL EXAM: Filed Vitals:   08/08/15 0952  BP: 122/76  Pulse: 88  Resp: 18   General: No acute distress Head:  Normocephalic/atraumatic Neck: supple, no full range of motion Heart:  Regular rate and rhythm Lungs:  Clear to auscultation bilaterally Back: No paraspinal tenderness Skin/Extremities: No rash, no edema Neurological Exam: alert, tracks, non-verbal, does not follow commands. Cranial nerves: Pupils equal, round, reactive to light.  Unable to do fundoscopy. Extraocular movements grossly intact with no nystagmus. +blink to threat bilaterally. No facial asymmetry. Motor: unable to do formal testing, moves all extremities symmetrically. Sensation: withdraws to touch. Gait narrow-based and steady, no ataxia.   IMPRESSION: This is a [age] yo man with a history of static encephalopathy, non-verbal and does not follow commands at baseline, who had a witnessed convulsion last 04/25/15. On review of records, he had an ER visit in 2008 for questionable seizure with forehead laceration. Head CT unremarkable. He had an MRI at that time that was degraded by motion but overall no significant abnormalities. He is easily agitated by touch and will not tolerate an EEG. With prior history of possible seizure in 2008 and now another witnessed convulsion, he was started on Depakote 250mg  BID, with no seizures since August 2016. Continue to monitor clinically, follow-up in 1 year, call for any changes.   Thank you for allowing me to participate in his care.  Please do not hesitate to call for any questions or concerns.  The duration of this appointment visit  was 15 minutes of face-to-face time with the patient.  Greater than 50% of this time was spent in counseling, explanation of diagnosis, planning of further management, and coordination of care.   Patrcia DollyKaren Roda Lauture, M.D.   CC: Dr. Cyndia BentBadger

## 2015-08-08 NOTE — Patient Instructions (Signed)
1. Continue Depakote 250mg twice a day 2. Follow-up in 1 year, call for any changes 

## 2016-02-20 ENCOUNTER — Encounter: Payer: Self-pay | Admitting: Family Medicine

## 2016-05-18 ENCOUNTER — Other Ambulatory Visit: Payer: Self-pay | Admitting: Dentistry

## 2016-05-19 ENCOUNTER — Encounter (HOSPITAL_COMMUNITY)
Admission: RE | Admit: 2016-05-19 | Discharge: 2016-05-19 | Disposition: A | Discharge status: Home / Self Care | Payer: Medicaid Other | Source: Ambulatory Visit | Attending: Dentistry | Admitting: Dentistry

## 2016-05-19 ENCOUNTER — Encounter (HOSPITAL_COMMUNITY): Payer: Self-pay

## 2016-05-19 DIAGNOSIS — Z01818 Encounter for other preprocedural examination: Secondary | ICD-10-CM | POA: Insufficient documentation

## 2016-05-19 DIAGNOSIS — K029 Dental caries, unspecified: Secondary | ICD-10-CM | POA: Diagnosis not present

## 2016-05-19 HISTORY — DX: Acne, unspecified: L70.9

## 2016-05-19 HISTORY — DX: Vitamin D deficiency, unspecified: E55.9

## 2016-05-19 HISTORY — DX: Other seasonal allergic rhinitis: J30.2

## 2016-05-19 NOTE — Progress Notes (Signed)
PCP - Antony HasteMichael Badger Cardiologist - denies  Chest x-ray - not needed EKG - not needed Stress Test - denies ECHO - denies Cardiac Cath - denies      Patient denies shortness of breath, fever, cough and chest pain at PAT appointment

## 2016-05-19 NOTE — Pre-Procedure Instructions (Signed)
Aaron JimJeffery B Castillo  05/19/2016      [facility] - [place], [place] - [address] [place] [id number] Phone: [phone] Fax: [phone]    Your procedure is scheduled on August 31  [facility] Admitting at 0700 A.M.  Call this number if you have problems the morning of surgery:  [phone]   Remember:  Do not eat food or drink liquids after midnight.   Take these medicines the morning of surgery with A SIP OF WATER: atomoxetine, divalproex (depakote), lorazepam (ativan),   7 days prior to surgery STOP taking any Aspirin, Aleve, Naproxen, Ibuprofen, Motrin, Advil, Goody's, BC's, all herbal medications, fish oil, and all vitamins    Do not wear jewelry.  Do not wear lotions, powders, or Cologne, or deoderant.  Do not shave 48 hours prior to surgery.  Men may shave face and neck.  Do not bring valuables to the hospital.  [facility] is not responsible for any belongings or valuables.  Contacts, dentures or bridgework may not be worn into surgery.  Leave your suitcase in the car.  After surgery it may be brought to your room.  For patients admitted to the hospital, discharge time will be determined by your treatment team.  Patients discharged the day of surgery will not be allowed to drive home.    Special instructions:   [place]- Preparing For Surgery  Before surgery, you can play an important role. Because skin is not sterile, your skin needs to be as free of germs as possible. You can reduce the number of germs on your skin by washing with CHG (chlorahexidine gluconate) Soap before surgery.  CHG is an antiseptic cleaner which kills germs and bonds with the skin to continue killing germs even after washing.  Please do not use if you have an allergy to CHG or antibacterial soaps. If your skin becomes reddened/irritated stop using the CHG.  Do not shave (including legs and underarms) for at least 48 hours prior to  first CHG shower. It is OK to shave your face.  Please follow these instructions carefully.   1. Shower the NIGHT BEFORE SURGERY and the MORNING OF SURGERY with CHG.   2. If you chose to wash your hair, wash your hair first as usual with your normal shampoo.  3. After you shampoo, rinse your hair and body thoroughly to remove the shampoo.  4. Use CHG as you would any other liquid soap. You can apply CHG directly to the skin and wash gently with a scrungie or a clean washcloth.   5. Apply the CHG Soap to your body ONLY FROM THE NECK DOWN.  Do not use on open wounds or open sores. Avoid contact with your eyes, ears, mouth and genitals (private parts). Wash genitals (private parts) with your normal soap.  6. Wash thoroughly, paying special attention to the area where your surgery will be performed.  7. Thoroughly rinse your body with warm water from the neck down.  8. DO NOT shower/wash with your normal soap after using and rinsing off the CHG Soap.  9. Pat yourself dry with a CLEAN TOWEL.   10. Wear CLEAN PAJAMAS   11. Place CLEAN SHEETS on your bed the night of your first shower and DO NOT SLEEP WITH PETS.    Day of Surgery: Do not apply any deodorants/lotions. Please wear clean clothes to the hospital/surgery center.  Please read over the following fact sheets that you were given. Pain Booklet and Coughing and Deep Breathing

## 2016-05-21 ENCOUNTER — Ambulatory Visit: Payer: Self-pay | Admitting: Dentistry

## 2016-05-21 ENCOUNTER — Ambulatory Visit (HOSPITAL_COMMUNITY): Payer: Medicaid Other | Admitting: Certified Registered Nurse Anesthetist

## 2016-05-21 ENCOUNTER — Encounter (HOSPITAL_COMMUNITY): Payer: Self-pay | Admitting: *Deleted

## 2016-05-21 ENCOUNTER — Encounter (HOSPITAL_COMMUNITY)
Admission: RE | Disposition: A | Discharge status: Skilled Nursing Facility | Payer: Self-pay | Source: Ambulatory Visit | Attending: Dentistry

## 2016-05-21 ENCOUNTER — Ambulatory Visit (HOSPITAL_COMMUNITY)
Admission: RE | Admit: 2016-05-21 | Discharge: 2016-05-21 | Disposition: A | Discharge status: Skilled Nursing Facility | Payer: Medicaid Other | Source: Ambulatory Visit | Attending: Dentistry | Admitting: Dentistry

## 2016-05-21 DIAGNOSIS — K029 Dental caries, unspecified: Secondary | ICD-10-CM | POA: Diagnosis not present

## 2016-05-21 HISTORY — PX: DENTAL RESTORATION/EXTRACTION WITH X-RAY: SHX5796

## 2016-05-21 SURGERY — DENTAL RESTORATION/EXTRACTION WITH X-RAY
Anesthesia: General

## 2016-05-21 MED ORDER — LACTATED RINGERS IV SOLN: INTRAVENOUS | Status: DC | PRN

## 2016-05-21 MED ORDER — LIDOCAINE-EPINEPHRINE 2 %-1:100000 IJ SOLN
INTRAMUSCULAR | Status: DC | PRN
  Administered 2016-05-21: 20 mL

## 2016-05-21 MED ORDER — FENTANYL CITRATE (PF) 100 MCG/2ML IJ SOLN
INTRAMUSCULAR | Status: AC
  Filled 2016-05-21: qty 4

## 2016-05-21 MED ORDER — DEXAMETHASONE SODIUM PHOSPHATE 10 MG/ML IJ SOLN
INTRAMUSCULAR | Status: DC | PRN
  Administered 2016-05-21: 10 mg via INTRAVENOUS

## 2016-05-21 MED ORDER — ONDANSETRON HCL 4 MG/2ML IJ SOLN
INTRAMUSCULAR | Status: DC | PRN
  Administered 2016-05-21: 4 mg via INTRAVENOUS

## 2016-05-21 MED ORDER — PROPOFOL 10 MG/ML IV BOLUS
INTRAVENOUS | Status: DC | PRN
  Administered 2016-05-21: 120 mg via INTRAVENOUS

## 2016-05-21 MED ORDER — CHLORHEXIDINE GLUCONATE 0.12 % MT SOLN
OROMUCOSAL | Status: DC | PRN
  Administered 2016-05-21: 15 mL via OROMUCOSAL

## 2016-05-21 MED ORDER — OXYMETAZOLINE HCL 0.05 % NA SOLN
NASAL | Status: AC
  Filled 2016-05-21: qty 15

## 2016-05-21 MED ORDER — SUGAMMADEX SODIUM 200 MG/2ML IV SOLN
INTRAVENOUS | Status: DC | PRN
Start: 2016-05-21 — End: 2016-05-21
  Administered 2016-05-21: 200 mg via INTRAVENOUS

## 2016-05-21 MED ORDER — MIDAZOLAM HCL 2 MG/ML PO SYRP
15.0000 mg | ORAL_SOLUTION | Freq: Once | ORAL | Status: AC
  Administered 2016-05-21: 15 mg via ORAL
  Filled 2016-05-21: qty 8

## 2016-05-21 MED ORDER — ROCURONIUM BROMIDE 100 MG/10ML IV SOLN
INTRAVENOUS | Status: DC | PRN
  Administered 2016-05-21: 50 mg via INTRAVENOUS

## 2016-05-21 MED ORDER — PROPOFOL 10 MG/ML IV BOLUS
INTRAVENOUS | Status: AC
  Filled 2016-05-21: qty 20

## 2016-05-21 MED ORDER — FENTANYL CITRATE (PF) 100 MCG/2ML IJ SOLN: 25.0000 ug | INTRAMUSCULAR | Status: DC | PRN

## 2016-05-21 MED ORDER — HEMOSTATIC AGENTS (NO CHARGE) OPTIME
TOPICAL | Status: DC | PRN
  Administered 2016-05-21: 1 via TOPICAL

## 2016-05-21 MED ORDER — HYDROMORPHONE HCL 1 MG/ML IJ SOLN: 0.2500 mg | INTRAMUSCULAR | Status: DC | PRN

## 2016-05-21 MED ORDER — LACTATED RINGERS IV SOLN
INTRAVENOUS | Status: DC | PRN
  Administered 2016-05-21 (×2): via INTRAVENOUS

## 2016-05-21 MED ORDER — LIDOCAINE HCL (CARDIAC) 20 MG/ML IV SOLN
INTRAVENOUS | Status: DC | PRN
  Administered 2016-05-21: 60 mg via INTRAVENOUS

## 2016-05-21 MED ORDER — LIDOCAINE-EPINEPHRINE 2 %-1:100000 IJ SOLN
INTRAMUSCULAR | Status: AC
  Filled 2016-05-21: qty 1

## 2016-05-21 MED ORDER — FENTANYL CITRATE (PF) 100 MCG/2ML IJ SOLN
INTRAMUSCULAR | Status: DC | PRN
  Administered 2016-05-21 (×3): 50 ug via INTRAVENOUS

## 2016-05-21 SURGICAL SUPPLY — 23 items
CANISTER SUCTION 2500CC (MISCELLANEOUS) ×2 IMPLANT
COVER PROBE W GEL 5X96 (DRAPES) ×2 IMPLANT
COVER SURGICAL LIGHT HANDLE (MISCELLANEOUS) ×2 IMPLANT
COVER TABLE BACK 60X90 (DRAPES) ×2 IMPLANT
DRAPE PROXIMA HALF (DRAPES) ×2 IMPLANT
GAUZE SPONGE 4X4 16PLY XRAY LF (GAUZE/BANDAGES/DRESSINGS) ×2 IMPLANT
GLOVE BIO SURGEON STRL SZ7.5 (GLOVE) ×2 IMPLANT
GOWN STRL REUS W/ TWL LRG LVL3 (GOWN DISPOSABLE) ×2 IMPLANT
GOWN STRL REUS W/TWL LRG LVL3 (GOWN DISPOSABLE) ×4
KIT BASIN OR (CUSTOM PROCEDURE TRAY) ×2 IMPLANT
KIT ROOM TURNOVER OR (KITS) ×2 IMPLANT
NDL 25GX 5/8IN NON SAFETY (NEEDLE) ×1 IMPLANT
NEEDLE 25GX 5/8IN NON SAFETY (NEEDLE) ×2 IMPLANT
NEEDLE 27GAX1X1/2 (NEEDLE) ×1 IMPLANT
PAD ARMBOARD 7.5X6 YLW CONV (MISCELLANEOUS) ×4 IMPLANT
SPONGE SURGIFOAM ABS GEL SZ50 (HEMOSTASIS) ×2 IMPLANT
SUT CHROMIC 3 0 PS 2 (SUTURE) ×3 IMPLANT
SYR CONTROL 10ML LL (SYRINGE) ×2 IMPLANT
TOOTHBRUSH ADULT (PERSONAL CARE ITEMS) ×1 IMPLANT
TOWEL OR 17X26 10 PK STRL BLUE (TOWEL DISPOSABLE) ×2 IMPLANT
TUBE CONNECTING 12X1/4 (SUCTIONS) ×2 IMPLANT
WATER TABLETS ICX (MISCELLANEOUS) ×2 IMPLANT
YANKAUER SUCT BULB TIP NO VENT (SUCTIONS) ×2 IMPLANT

## 2016-05-21 NOTE — Op Note (Signed)
Recall Exam, FMX, FMD, Extractions #4,8,9,18,31 with gel foam and 3-0 chromic suture, #12 facial composite.

## 2016-05-21 NOTE — Progress Notes (Signed)
Witness waste of 0.315ml versed in sink by Tonna CornerJessica Edwards RN.

## 2016-05-21 NOTE — Progress Notes (Signed)
Versed 0.5mg  wasted with Sherian ReinPam Hawks, RN

## 2016-05-21 NOTE — Brief Op Note (Signed)
05/21/2016  12:00 PM  PATIENT:  Aaron JimJeffery B Castillo  35 y.o. male  PRE-OPERATIVE DIAGNOSIS:  dental carries  POST-OPERATIVE DIAGNOSIS:  dental carries  PROCEDURE:  Procedure(s): DENTAL RESTORATION/EXTRACTION TEETH #4,8,9,18, and 31, COMPOSITE FILLING #45F WITH X-RAY (N/A)  SURGEON:  Surgeon(s) and Role:    * Boneta LucksWilliam Cace Osorto, DDS - Primary  PHYSICIAN ASSISTANT:   ASSISTANTS: {Mariann Bass HESIA:   general  EBL:  Total I/O In: 1000 [I.V.:1000] Out: 30 [Blood:30]  BLOOD ADMINISTERED:none  DRAINS: none   LOCAL MEDICATIONS USED:  XYLOCAINE   SPECIMEN:  No Specimen  DISPOSITION OF SPECIMEN:  N/A  COUNTS:  YES  TOURNIQUET:  * No tourniquets in log *  DICTATION: .Note written in EPIC  PLAN OF CARE: Discharge to home after PACU  PATIENT DISPOSITION:  PACU - hemodynamically stable.   Delay start of Pharmacological VTE agent (>24hrs) due to surgical blood loss or risk of bleeding: not applicable

## 2016-05-21 NOTE — H&P (Unsigned)
H&P reviewed, no changes in assessment  

## 2016-05-21 NOTE — Anesthesia Procedure Notes (Signed)
Procedure Name: Intubation Date/Time: 05/21/2016 10:35 AM Performed by: Tillman AbideHAWKINS, Keighan Amezcua B Pre-anesthesia Checklist: Patient identified, Emergency Drugs available, Suction available and Patient being monitored Patient Re-evaluated:Patient Re-evaluated prior to inductionOxygen Delivery Method: Circle System Utilized Preoxygenation: Pre-oxygenation with 100% oxygen Intubation Type: IV induction Ventilation: Mask ventilation without difficulty Laryngoscope Size: Mac and 3 Grade View: Grade II Nasal Tubes: Nasal Rae, Nasal prep performed, Left and Magill forceps- large, utilized Tube size: 7.0 mm Number of attempts: 1 Placement Confirmation: ETT inserted through vocal cords under direct vision,  positive ETCO2 and breath sounds checked- equal and bilateral Tube secured with: Tape Dental Injury: Teeth and Oropharynx as per pre-operative assessment

## 2016-05-21 NOTE — Anesthesia Preprocedure Evaluation (Addendum)
Anesthesia Evaluation  Patient identified by MRN, date of birth, ID band Patient awake    Reviewed: Allergy & Precautions, NPO status , Patient's Chart, lab work & pertinent test results  Airway Mallampati: II  TM Distance: >3 FB     Dental   Pulmonary neg pulmonary ROS,    breath sounds clear to auscultation       Cardiovascular negative cardio ROS   Rhythm:Regular Rate:Normal     Neuro/Psych    GI/Hepatic negative GI ROS, Neg liver ROS,   Endo/Other  negative endocrine ROS  Renal/GU negative Renal ROS     Musculoskeletal   Abdominal   Peds  Hematology   Anesthesia Other Findings   Reproductive/Obstetrics                            Anesthesia Physical Anesthesia Plan  ASA: II  Anesthesia Plan: General   Post-op Pain Management:    Induction: Intravenous  Airway Management Planned: Nasal ETT  Additional Equipment:   Intra-op Plan:   Post-operative Plan: Extubation in OR  Informed Consent:   Plan Discussed with: CRNA and Anesthesiologist  Anesthesia Plan Comments:         Anesthesia Quick Evaluation

## 2016-05-21 NOTE — Transfer of Care (Signed)
Immediate Anesthesia Transfer of Care Note  Patient: Mason JimJeffery B Feider  Procedure(s) Performed: Procedure(s): DENTAL RESTORATION/EXTRACTION TEETH #4,8,9,18, and 31, COMPOSITE FILLING #83F WITH X-RAY (N/A)  Patient Location: PACU  Anesthesia Type:General  Level of Consciousness: sedated  Airway & Oxygen Therapy: Patient Spontanous Breathing and Patient connected to nasal cannula oxygen  Post-op Assessment: Report given to RN and Post -op Vital signs reviewed and stable  Post vital signs: Reviewed and stable  Last Vitals:  Vitals:   05/21/16 0715 05/21/16 1210  BP: 110/61 124/60  Pulse: 96 99  Resp: 20 12  Temp: 36.9 C 37 C    Last Pain:  Vitals:   05/21/16 1210  TempSrc:   PainSc: 0-No pain         Complications: No apparent anesthesia complications

## 2016-05-21 NOTE — H&P (Signed)
H&P reviewed, no changes in assessment  

## 2016-05-21 NOTE — Anesthesia Postprocedure Evaluation (Signed)
Anesthesia Post Note  Patient: Aaron JimJeffery B Castillo  Procedure(s) Performed: Procedure(s) (LRB): DENTAL RESTORATION/EXTRACTION TEETH #4,8,9,18, and 31, COMPOSITE FILLING #32F WITH X-RAY (N/A)  Patient location during evaluation: PACU Anesthesia Type: General Level of consciousness: awake Pain management: pain level controlled Vital Signs Assessment: post-procedure vital signs reviewed and stable Respiratory status: spontaneous breathing Cardiovascular status: stable Anesthetic complications: no    Last Vitals:  Vitals:   05/21/16 1239 05/21/16 1245  BP: 126/79   Pulse: 98   Resp: 14   Temp:  36.9 C    Last Pain:  Vitals:   05/21/16 1210  TempSrc:   PainSc: 0-No pain                 EDWARDS,[name]

## 2016-05-22 ENCOUNTER — Encounter (HOSPITAL_COMMUNITY): Payer: Self-pay | Admitting: Dentistry

## 2016-05-22 NOTE — Op Note (Signed)
NAMKingsley Castillo:  [name], [name]                ACCOUNT NO.:  [id number]  MEDICAL RECORD NO.:  [id number]  LOCATION:  [facility]                         FACILITY:  [facility]  PHYSICIAN:  Esaw DaceWilliam E. 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  Dental care provided in the OR for medically necessary treatment.  OPERATION:  Full mouth oral rehabilitation including exam, x-rays, cleaning, and extractions.  OPERATIVE SURGEON:  Azucena FreedWilliam E. Mykalah Saari, D.D.S.  ASSISTANT:  Laqueta CarinaMary Elizabeth Andreyev and hospital staff.  ANESTHESIA:  General.  PROCEDURE DESCRIPTION:  The patient was brought into the operating room and placed in supine position.  General anesthesia was administered via nasal intubation.  The patient was prepped and draped in the usual manner for an intraoral general dentistry procedure.  Oropharynx was suctioned, and a moistened posterior throat pack was placed.  A full intraoral exam including all hard and soft tissues was performed.  This was a recall exam.  Soft tissue exam reveals the floor of mouth, buccal mucosa, soft palate, hard palate, tongue, gingival, and frenum attachments all within normal limits with regard to size, color, and consistency.  Hard tissue exam reveals #1 and 2 present, 4 present, 5 through 7 missing, 8 and 9 present, 10 and 11 missing, 12 and 13 present, 14 missing, 15 present, 16 and 17 missing, 18, 19, and 20 present, 21 and 22 missing, 23 through 26 present, 27 missing, 28 through 31 present, 32 missing.  A full-mouth series of digital x-rays was taken and full-mouth debridement was completed.  Operative care was provided with a high-speed handpiece #557 burr and a slow-speed handpiece #4 bur.  Composite was completed on #12 facial.  Extractions were completed  on #4, 8, 9, 18, and 31.  Gelfoam was inserted into the extraction sites, 3-0 chromic suture was used to close the size with 6 mL of 2% Xylocaine with 1:100,000 epinephrine was given.  Estimated blood loss 30 mL.  The mouth was suctioned dry and a posterior throat pack was carefully removed with constant suction.  The patient was awakened in the OR and transferred to the recovery room in good condition.  An extraction sheet was signed by the patient's representative.  Post extraction prescriptions; Norco 5/325 mg 12 tabs one q.4 hours p.r.n. pain and amoxicillin 500 mg 1 t.i.d. x7 days for post extraction infection.     Esaw DaceWilliam E. Anyelin Mogle Jr., D.D.S.     WEM/MEDQ  D:  05/21/2016  T:  05/22/2016  Job:  [id number]

## 2016-08-07 ENCOUNTER — Ambulatory Visit: Payer: Medicaid Other | Admitting: Neurology

## 2016-08-07 ENCOUNTER — Encounter: Payer: Self-pay | Admitting: Neurology

## 2016-08-07 ENCOUNTER — Ambulatory Visit (INDEPENDENT_AMBULATORY_CARE_PROVIDER_SITE_OTHER): Payer: Medicaid Other | Admitting: Neurology

## 2016-08-07 VITALS — BP 118/70 | HR 106 | Ht 71.0 in | Wt 137.0 lb

## 2016-08-07 DIAGNOSIS — G9349 Other encephalopathy: Secondary | ICD-10-CM

## 2016-08-07 DIAGNOSIS — G40309 Generalized idiopathic epilepsy and epileptic syndromes, not intractable, without status epilepticus: Secondary | ICD-10-CM | POA: Diagnosis not present

## 2016-08-07 NOTE — Patient Instructions (Signed)
1. Continue Depakote 250mg  twice a day 2. Follow-up in 1 year, call for any changes

## 2016-08-07 NOTE — Progress Notes (Signed)
NEUROLOGY FOLLOW UP OFFICE NOTE  [name] [id number]  HISTORY OF PRESENT ILLNESS: I had the pleasure of seeing Aaron CallanderJeffery Castillo in follow-up in the neurology clinic on 08/07/2016.  The patient was last seen a year ago for seizures. He is again accompanied by group home staff who provides history today as [name] is non-verbal and does not follow any commands.  He had a witnessed seizure on 04/25/15, and previous to that he was brought to the ER in 2008. He does not like to be touched, and would not tolerate an EEG. MRI brain no acute changes. He was started on Depakote 250mg  BID, which he is tolerating with no further seizures since August 2016. No visible side effects, no drowsiness or behavioral changes. Appetite and sleep are good per staff.   HPI: This is a 35 yo man with a history of static encephalopathy with intellectual disability, non-verbal, ADHD, anxiety, presenting for evaluation of seizures. He was in the ER last 04/25/15 after he had a witnessed convulsion at the group home. Group home staff today witnessed the episode, he was sitting on the couch, then suddenly stiffened up and had a convulsion lasting about 3 minutes. It took 4-5 minutes to return to baseline. [facility] where CBC, BMP, urinalysis were unremarkable.Glucose level 186. Staff today reports he was not eating much that morning, he usually can feed himself but would be messy, but that morning he was just sitting and appeared to be waiting to be fed, which is unusual for him. They deny any recent infections. He was back to baseline in the ER. He had a head CT which I personally reviewed, which did not show any acute changes.   On review of records, he was brought to the ER in August 2008 for report of a seizure. Per ER notes, he had seizure activity with laceration to the left eyebrow. Head CT had shown a small focus of high attenuation in the right temporal lobe, possible focal hemorrhagic contusion. A follow-up  MRI brain was significantly degraded by motion, images obtained did not show any acute changes or significant abnormality. It was felt that seizure history was unclear, and he was not started on anti-seizure medication. According to ER notes, his mother came to the hospital and stated he did not have a seizure before to their knowledge. There is not much history available. According to group home staff, at baseline he is non-verbal, does not consistently follow commands, needing prompting to do things such as walk and sit. He can feed himself but is messy. He needs help with dressing and bathing. He can get agitated and aggressive, hitting and grabbing, and takes Arivan 0.5mg  four times a day for this. Per ER notes, no missed doses.   PAST MEDICAL HISTORY: Past Medical History:  Diagnosis Date  . Acne   . ADHD (attention deficit hyperactivity disorder)   . Anxiety   . Autism   . Mental retardation   . Seasonal allergies   . Seizures (HCC)   . Vitamin D deficiency     MEDICATIONS: Current Outpatient Prescriptions on File Prior to Visit  Medication Sig Dispense Refill  . atomoxetine (STRATTERA) 40 MG capsule Take 40 mg by mouth 2 (two) times daily.    . carbamide peroxide (DEBROX) 6.5 % otic solution Place 5 drops into both ears 2 (two) times a week. For cerumen impaction    . divalproex (DEPAKOTE) 250 MG DR tablet Take 1 tablet (250  mg total) by mouth 2 (two) times daily. 60 tablet 6  . LORazepam (ATIVAN) 0.5 MG tablet Take 0.5 mg by mouth 4 (four) times daily.    . Nutritional Supplements (ENSURE PO) Take 1 Can by mouth 2 (two) times daily between meals.    . paroxetine mesylate (PEXEVA) 20 MG tablet Take 20 mg by mouth daily.     No current facility-administered medications on file prior to visit.     ALLERGIES: No Known Allergies  FAMILY HISTORY: No family history on file.  SOCIAL HISTORY: Social History   Social History  . Marital status: Single    Spouse name: N/A  .  Number of children: N/A  . Years of education: N/A   Occupational History  . Not on file.   Social History Main Topics  . Smoking status: Never Smoker  . Smokeless tobacco: Never Used  . Alcohol use No  . Drug use: No  . Sexual activity: Not on file   Other Topics Concern  . Not on file   Social History Narrative  . No narrative on file    REVIEW OF SYSTEMS: unable to obtain due to patient being non-verbal  PHYSICAL EXAM: Vitals:   08/07/16 0828  BP: 118/70  Pulse: (!) 106   General: No acute distress Head:  Normocephalic/atraumatic Neck: supple, no full range of motion Heart:  Regular rate and rhythm Lungs:  Clear to auscultation bilaterally Back: No paraspinal tenderness Skin/Extremities: No rash, no edema Neurological Exam: alert, tracks, non-verbal, does not follow commands. Cranial nerves: Pupils equal, round, reactive to light.  Unable to do fundoscopy. Extraocular movements grossly intact with no nystagmus. +blink to threat bilaterally. No facial asymmetry. Motor: unable to do formal testing, moves all extremities symmetrically. Sensation: withdraws to touch. Gait narrow-based and steady, no ataxia. No tremors.  IMPRESSION: This is a 35 yo man with a history of static encephalopathy, non-verbal and does not follow commands at baseline, who had a witnessed convulsion last 04/25/15. On review of records, he had an ER visit in 2008 for questionable seizure with forehead laceration. Head CT unremarkable. He had an MRI at that time that was degraded by motion but overall no significant abnormalities. He is easily agitated by touch and will not tolerate an EEG. With prior history of possible seizure in 2008 and now another witnessed convulsion, he was started on Depakote 250mg  BID, and continues to do well with no seizures since August 2016. Continue to monitor clinically, follow-up in 1 year, call for any changes.   Thank you for allowing me to participate in his care.  Please  do not hesitate to call for any questions or concerns.  The duration of this appointment visit was 15 minutes of face-to-face time with the patient.  Greater than 50% of this time was spent in counseling, explanation of diagnosis, planning of further management, and coordination of care.   Patrcia DollyKaren Jakhia Buxton, M.D.   CC: Dr. Cyndia BentBadger

## 2017-02-26 IMAGING — CT CT HEAD W/O CM
1 series · 16 of 30 positions shown, 20 images · non-contrast
Comparison: Brain MRI and CT 05/03/2007

CLINICAL DATA: Witnessed seizure at [REDACTED] today. History of
seizures in the past.

EXAM:
CT HEAD WITHOUT CONTRAST
TECHNIQUE: Contiguous axial images were obtained from the base of the skull
through the vertex without intravenous contrast.

[Series 2: head 5.0 h30s · axial · 0.47mm/px · z∈[-124,+21]mm · 16 of 33 slices shown, 20 images]
[im 2/33  brain]
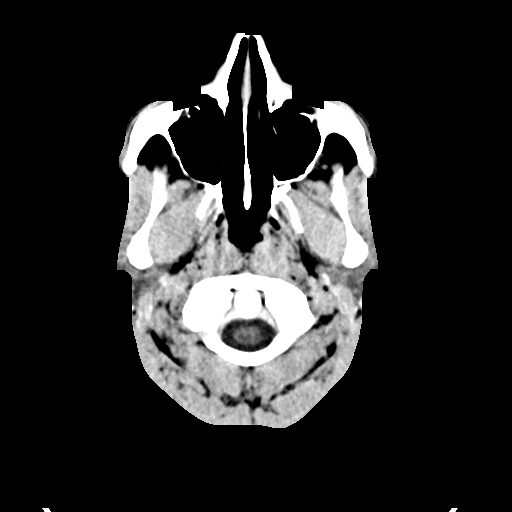
[im 2/33  bone]
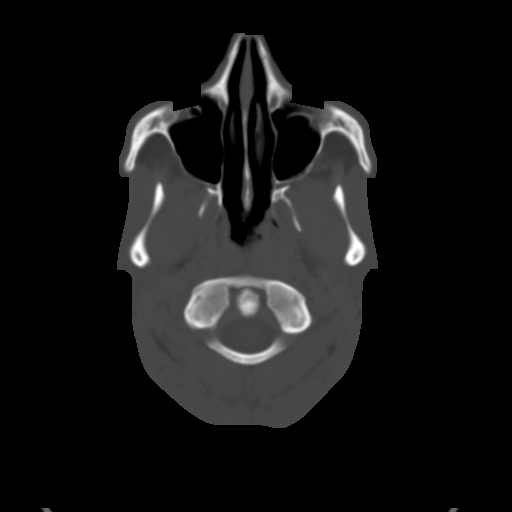
[im 4/33  brain]
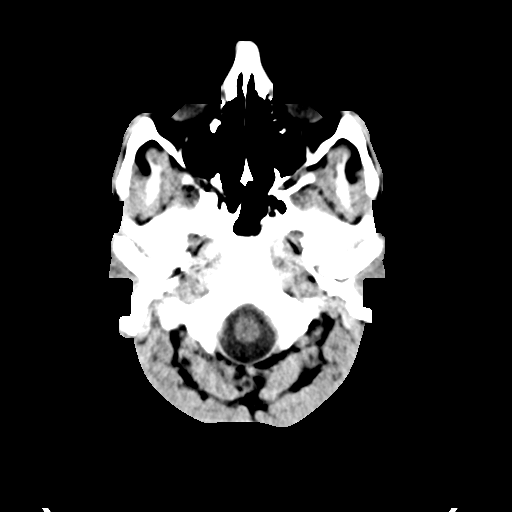
[im 6/33  brain]
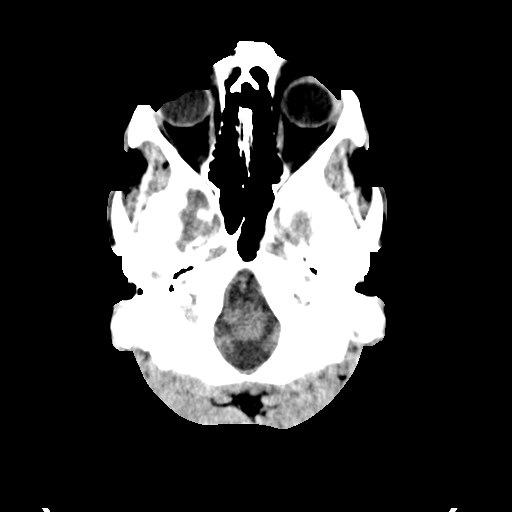
[im 8/33  brain]
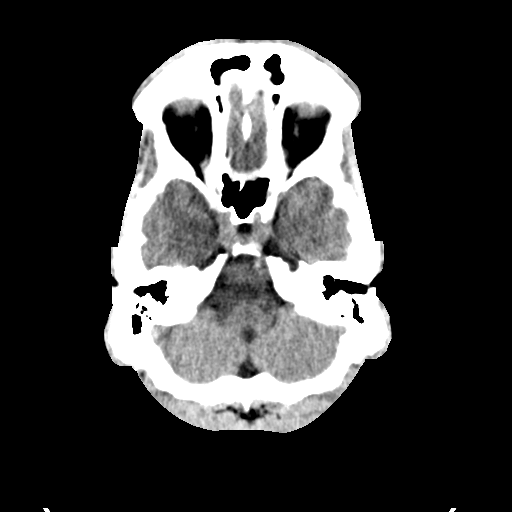
[im 9/33  brain]
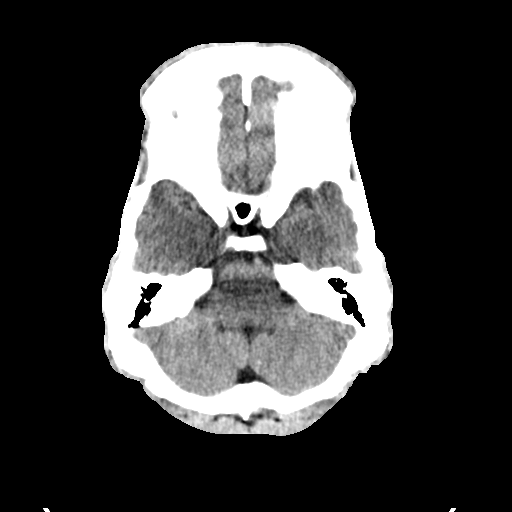
[im 9/33  bone]
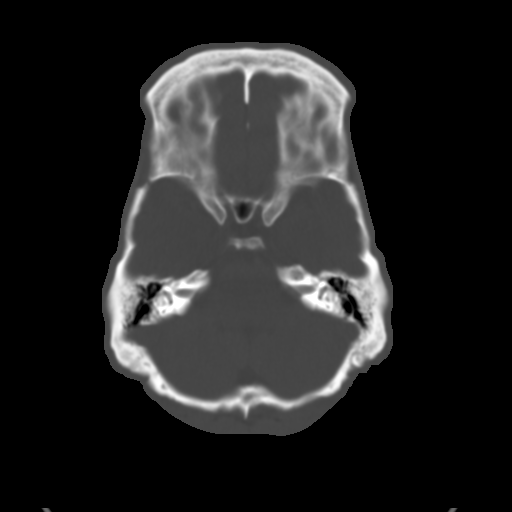
[im 12/33  brain]
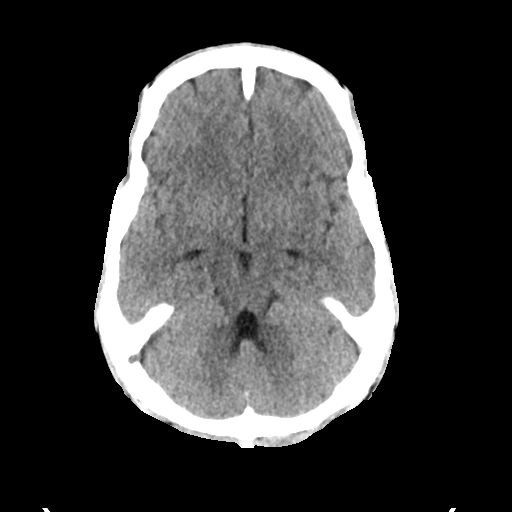
[im 14/33  brain]
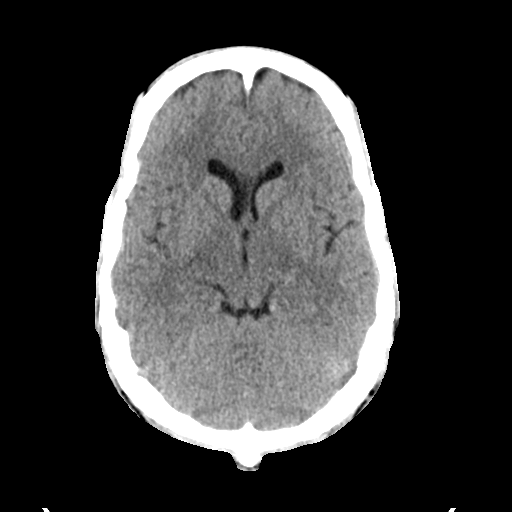
[im 16/33  brain]
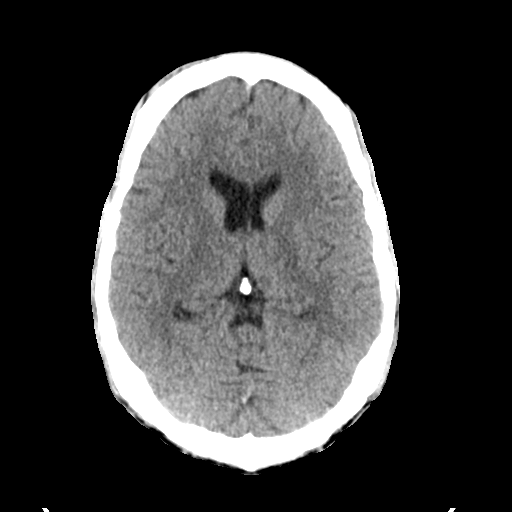
[im 17/33  brain]
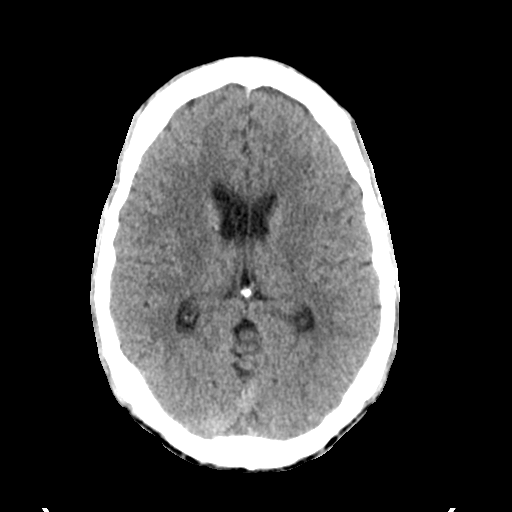
[im 17/33  bone]
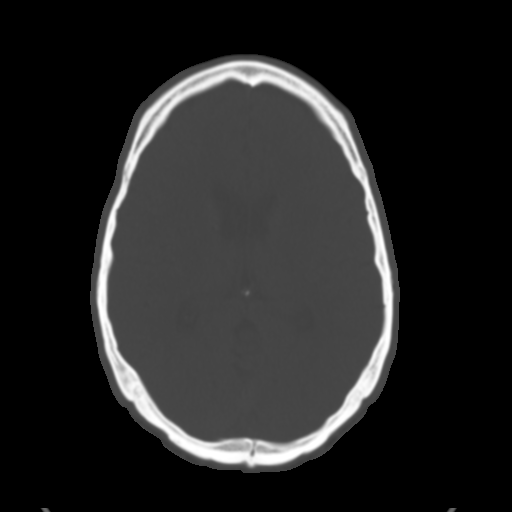
[im 19/33  brain]
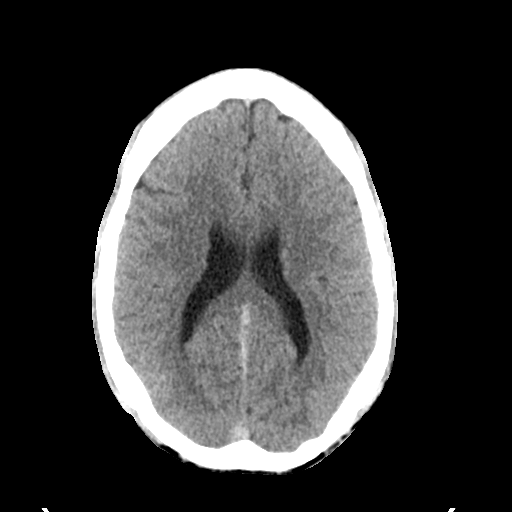
[im 21/33  brain]
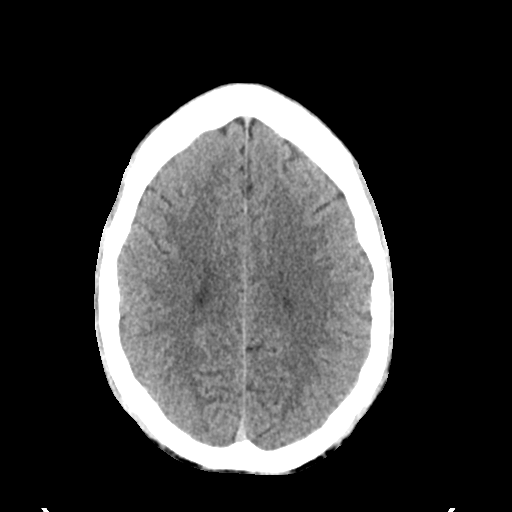
[im 24/33  brain]
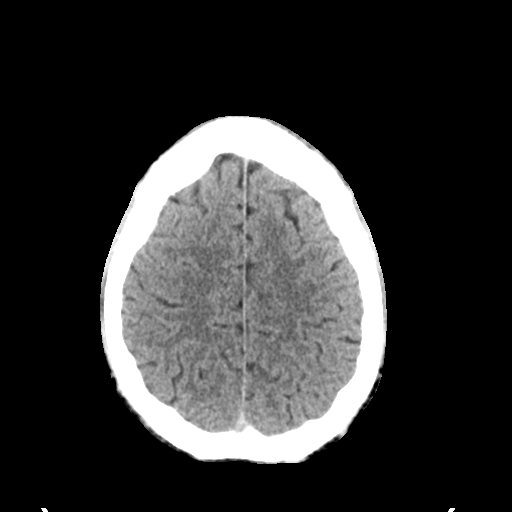
[im 25/33  brain]
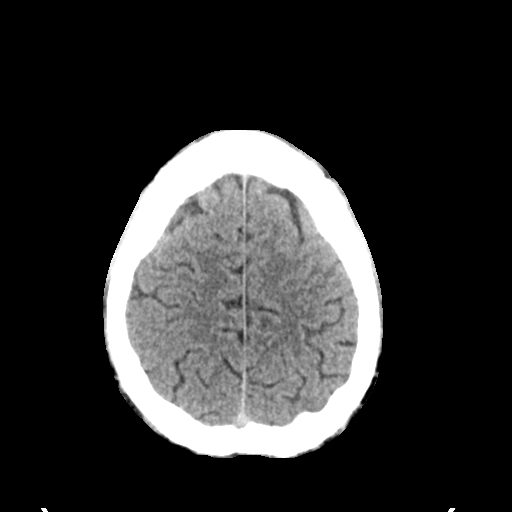
[im 25/33  bone]
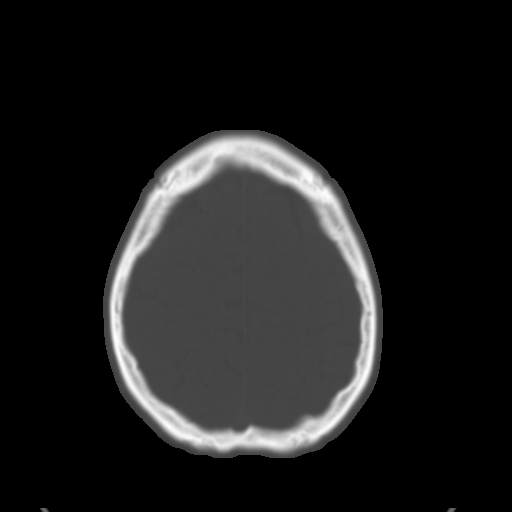
[im 27/33  brain]
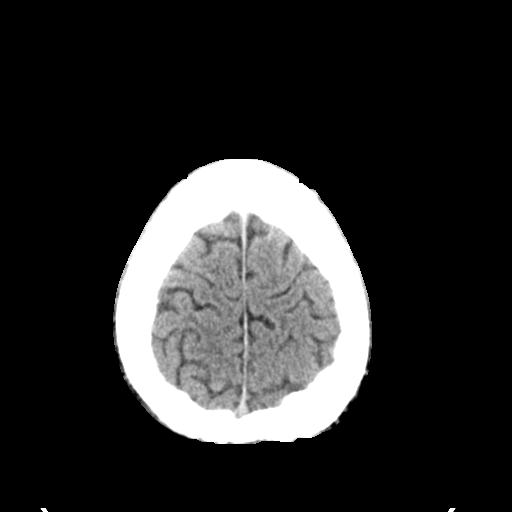
[im 29/33  brain]
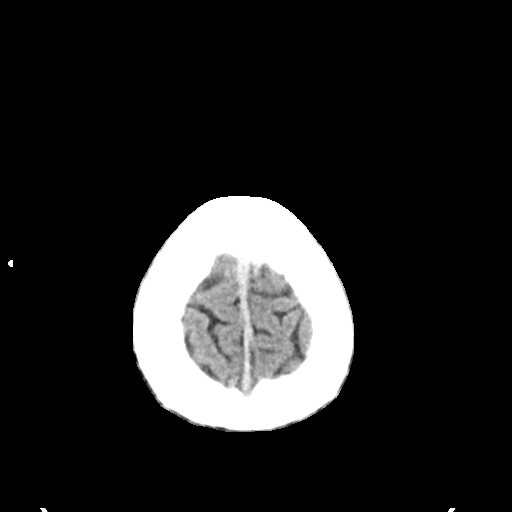
[im 31/33  brain]
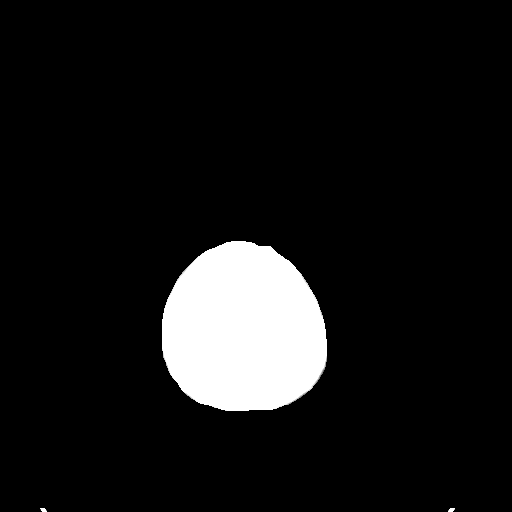

[16 of 30 positions shown; findings below may reference images not displayed]

FINDINGS: The ventricles and sulci are within normal limits for age. There is
no evidence of acute infarct, intracranial hemorrhage, mass, midline
shift, or extra-axial collection.

The orbits are unremarkable. There are small bilateral mastoid
effusions. No significant paranasal sinus mucosal disease is
present. No skull fracture is identified.
IMPRESSION: Unremarkable CT appearance of the brain.

## 2017-08-09 ENCOUNTER — Ambulatory Visit: Payer: Medicaid Other | Admitting: Neurology

## 2017-10-08 ENCOUNTER — Ambulatory Visit (INDEPENDENT_AMBULATORY_CARE_PROVIDER_SITE_OTHER): Payer: Medicaid Other | Admitting: Neurology

## 2017-10-08 ENCOUNTER — Encounter: Payer: Self-pay | Admitting: Neurology

## 2017-10-08 VITALS — Wt 131.0 lb

## 2017-10-08 DIAGNOSIS — F73 Profound intellectual disabilities: Secondary | ICD-10-CM | POA: Insufficient documentation

## 2017-10-08 DIAGNOSIS — E559 Vitamin D deficiency, unspecified: Secondary | ICD-10-CM | POA: Insufficient documentation

## 2017-10-08 DIAGNOSIS — F909 Attention-deficit hyperactivity disorder, unspecified type: Secondary | ICD-10-CM | POA: Insufficient documentation

## 2017-10-08 DIAGNOSIS — F840 Autistic disorder: Secondary | ICD-10-CM | POA: Insufficient documentation

## 2017-10-08 DIAGNOSIS — G9349 Other encephalopathy: Secondary | ICD-10-CM | POA: Diagnosis not present

## 2017-10-08 DIAGNOSIS — G40309 Generalized idiopathic epilepsy and epileptic syndromes, not intractable, without status epilepticus: Secondary | ICD-10-CM

## 2017-10-08 NOTE — Patient Instructions (Signed)
1. Continue Depakote 250mg twice a day 2. Follow-up in 1 year, call for any changes 

## 2017-10-08 NOTE — Progress Notes (Signed)
NEUROLOGY FOLLOW UP OFFICE NOTE  [name] [id number]  DOB: 05-28-1981  HISTORY OF PRESENT ILLNESS: I had the pleasure of seeing [name] in follow-up in the neurology clinic on 10/08/2017.  The patient was last seen a little over a year ago for seizures. He is again accompanied by group home staff who provides history today. [name] is non-verbal and does not follow any commands. There have been no further seizures since August 2016. Prior to this, he was brought to the ER in 2008 for possible seizure. He does not like to be touched, and would not tolerate an EEG. MRI brain no acute changes. He has been taking Depakote 250mg  BID with no visible side effects, no drowsiness or behavioral changes. Appetite and sleep are good per staff.   HPI: This is a 37 yo man with a history of static encephalopathy with intellectual disability, non-verbal, ADHD, anxiety, presenting for evaluation of seizures. He was in the ER last 04/25/15 after he had a witnessed convulsion at the group home. Group home staff today witnessed the episode, he was sitting on the couch, then suddenly stiffened up and had a convulsion lasting about 3 minutes. It took 4-5 minutes to return to baseline. [facility] where CBC, BMP, urinalysis were unremarkable.Glucose level 186. Staff today reports he was not eating much that morning, he usually can feed himself but would be messy, but that morning he was just sitting and appeared to be waiting to be fed, which is unusual for him. They deny any recent infections. He was back to baseline in the ER. He had a head CT which I personally reviewed, which did not show any acute changes.   On review of records, he was brought to the ER in August 2008 for report of a seizure. Per ER notes, he had seizure activity with laceration to the left eyebrow. Head CT had shown a small focus of high attenuation in the right temporal lobe, possible focal hemorrhagic contusion. A follow-up MRI  brain was significantly degraded by motion, images obtained did not show any acute changes or significant abnormality. It was felt that seizure history was unclear, and he was not started on anti-seizure medication. According to ER notes, his mother came to the hospital and stated he did not have a seizure before to their knowledge. There is not much history available. According to group home staff, at baseline he is non-verbal, does not consistently follow commands, needing prompting to do things such as walk and sit. He can feed himself but is messy. He needs help with dressing and bathing. He can get agitated and aggressive, hitting and grabbing, and takes Arivan 0.5mg  four times a day for this. Per ER notes, no missed doses.   PAST MEDICAL HISTORY: Past Medical History:  Diagnosis Date  . Acne   . ADHD (attention deficit hyperactivity disorder)   . Anxiety   . Autism   . Mental retardation   . Seasonal allergies   . Seizures (HCC)   . Vitamin D deficiency     MEDICATIONS: Current Outpatient Medications on File Prior to Visit  Medication Sig Dispense Refill  . atomoxetine (STRATTERA) 40 MG capsule Take 40 mg by mouth 2 (two) times daily.    . carbamide peroxide (DEBROX) 6.5 % otic solution Place 5 drops into both ears 2 (two) times a week. For cerumen impaction    . divalproex (DEPAKOTE) 250 MG DR tablet Take 1 tablet (250 mg total)  by mouth 2 (two) times daily. 60 tablet 6  . LORazepam (ATIVAN) 0.5 MG tablet Take 0.5 mg by mouth 4 (four) times daily.    . Nutritional Supplements (ENSURE PO) Take 1 Can by mouth 2 (two) times daily between meals.    . paroxetine mesylate (PEXEVA) 20 MG tablet Take 20 mg by mouth daily.     No current facility-administered medications on file prior to visit.     ALLERGIES: No Known Allergies  FAMILY HISTORY: No family history on file.  SOCIAL HISTORY: Social History   Socioeconomic History  . Marital status: Single    Spouse name: Not on file    . Number of children: Not on file  . Years of education: Not on file  . Highest education level: Not on file  Social Needs  . Financial resource strain: Not on file  . Food insecurity - worry: Not on file  . Food insecurity - inability: Not on file  . Transportation needs - medical: Not on file  . Transportation needs - non-medical: Not on file  Occupational History  . Not on file  Tobacco Use  . Smoking status: Never Smoker  . Smokeless tobacco: Never Used  Substance and Sexual Activity  . Alcohol use: No    Alcohol/week: 0.0 oz  . Drug use: No  . Sexual activity: Not on file  Other Topics Concern  . Not on file  Social History Narrative  . Not on file    REVIEW OF SYSTEMS: unable to obtain due to patient being non-verbal  PHYSICAL EXAM: There were no vitals filed for this visit. Patient became agitated when staff tried to do vital signs.  General: No acute distress Head:  Normocephalic/atraumatic Neck: supple, no full range of motion Heart:  Regular rate and rhythm Lungs:  Clear to auscultation bilaterally Skin/Extremities: No rash, no edema Neurological Exam: alert, tracks, non-verbal, does not follow commands. Cranial nerves: Pupils equal, round, reactive to light.  Unable to do fundoscopy. Extraocular movements grossly intact with no nystagmus. +blink to threat bilaterally. No facial asymmetry. Motor: unable to do formal testing, moves all extremities symmetrically. Sensation: withdraws to touch. Gait narrow-based and steady, no ataxia. No tremors.  IMPRESSION: This is a 37 yo man with a history of static encephalopathy, non-verbal and does not follow commands at baseline, who had a witnessed convulsion last 04/25/15. On review of records, he had an ER visit in 2008 for questionable seizure with forehead laceration. Head CT unremarkable. He had an MRI at that time that was degraded by motion but overall no significant abnormalities. He is easily agitated by touch and will  not tolerate an EEG. With prior history of possible seizure in 2008 and now another witnessed convulsion, he was started on Depakote 250mg  BID, he continues to do well with no seizures since August 2016. His PCP does regular bloodwork for CBC, CMP per staff, continue to monitor clinically, follow-up in 1 year, call for any changes.   Thank you for allowing me to participate in his care.  Please do not hesitate to call for any questions or concerns.  The duration of this appointment visit was 15 minutes of face-to-face time with the patient.  Greater than 50% of this time was spent in counseling, explanation of diagnosis, planning of further management, and coordination of care.   Patrcia Dolly, M.D.   CC: Dr. Cyndia Bent

## 2018-10-07 ENCOUNTER — Other Ambulatory Visit: Payer: Self-pay

## 2018-10-07 ENCOUNTER — Ambulatory Visit (INDEPENDENT_AMBULATORY_CARE_PROVIDER_SITE_OTHER): Payer: Medicaid Other | Admitting: Neurology

## 2018-10-07 ENCOUNTER — Encounter: Payer: Self-pay | Admitting: Neurology

## 2018-10-07 VITALS — BP 112/68 | HR 104 | Wt 134.0 lb

## 2018-10-07 DIAGNOSIS — F989 Unspecified behavioral and emotional disorders with onset usually occurring in childhood and adolescence: Secondary | ICD-10-CM | POA: Diagnosis not present

## 2018-10-07 DIAGNOSIS — G40309 Generalized idiopathic epilepsy and epileptic syndromes, not intractable, without status epilepticus: Secondary | ICD-10-CM

## 2018-10-07 DIAGNOSIS — G9349 Other encephalopathy: Secondary | ICD-10-CM | POA: Diagnosis not present

## 2018-10-07 MED ORDER — DIVALPROEX SODIUM 250 MG PO DR TAB
DELAYED_RELEASE_TABLET | ORAL | 11 refills | Status: DC
Start: 2018-10-07 — End: 2019-10-06

## 2018-10-07 NOTE — Progress Notes (Signed)
NEUROLOGY FOLLOW UP OFFICE NOTE  [name] [id number]  DOB: 10/14/1980  HISTORY OF PRESENT ILLNESS: I had the pleasure of seeing [name] in follow-up in the neurology clinic on 10/07/2018.  The patient was last seen a year ago for seizures. He is again accompanied by group home staff who provides history today. [name] is non-verbal and does not follow any commands. Since his last visit, staff denies any seizures since August 2016. Prior to this, he was in the ER in 2008 for possible seizure. He does not like to be touched, and would not tolerate an EEG. MRI brain no acute changes. He is tolerating Depakote 250gm BID without side effects. Staff is reporting more behavioral changes recently, he would hit, grab/pull, or resist when redirected, usually during meals. Appetite and sleep are good per staff. He has not seen Psychiatry recently.   HPI: This is a 38 yo man with a history of static encephalopathy with intellectual disability, non-verbal, ADHD, anxiety, presenting for evaluation of seizures. He was in the ER last 04/25/15 after he had a witnessed convulsion at the group home. Group home staff today witnessed the episode, he was sitting on the couch, then suddenly stiffened up and had a convulsion lasting about 3 minutes. It took 4-5 minutes to return to baseline. [facility] where CBC, BMP, urinalysis were unremarkable.Glucose level 186. Staff today reports he was not eating much that morning, he usually can feed himself but would be messy, but that morning he was just sitting and appeared to be waiting to be fed, which is unusual for him. They deny any recent infections. He was back to baseline in the ER. He had a head CT which I personally reviewed, which did not show any acute changes.   On review of records, he was brought to the ER in August 2008 for report of a seizure. Per ER notes, he had seizure activity with laceration to the left eyebrow. Head CT had shown a small  focus of high attenuation in the right temporal lobe, possible focal hemorrhagic contusion. A follow-up MRI brain was significantly degraded by motion, images obtained did not show any acute changes or significant abnormality. It was felt that seizure history was unclear, and he was not started on anti-seizure medication. According to ER notes, his mother came to the hospital and stated he did not have a seizure before to their knowledge. There is not much history available. According to group home staff, at baseline he is non-verbal, does not consistently follow commands, needing prompting to do things such as walk and sit. He can feed himself but is messy. He needs help with dressing and bathing. He can get agitated and aggressive, hitting and grabbing, and takes Arivan 0.5mg  four times a day for this. Per ER notes, no missed doses.   PAST MEDICAL HISTORY: Past Medical History:  Diagnosis Date  . Acne   . ADHD (attention deficit hyperactivity disorder)   . Anxiety   . Autism   . Mental retardation   . Seasonal allergies   . Seizures (HCC)   . Vitamin D deficiency     MEDICATIONS: Current Outpatient Medications on File Prior to Visit  Medication Sig Dispense Refill  . atomoxetine (STRATTERA) 40 MG capsule Take 40 mg by mouth 2 (two) times daily.    . carbamide peroxide (DEBROX) 6.5 % otic solution Place 5 drops into both ears 2 (two) times a week. For cerumen impaction    .  divalproex (DEPAKOTE) 250 MG DR tablet Take 1 tablet (250 mg total) by mouth 2 (two) times daily. 60 tablet 6  . LORazepam (ATIVAN) 0.5 MG tablet Take 0.5 mg by mouth 4 (four) times daily.    . Nutritional Supplements (ENSURE PO) Take 1 Can by mouth 2 (two) times daily between meals.    . paroxetine mesylate (PEXEVA) 20 MG tablet Take 20 mg by mouth daily.     No current facility-administered medications on file prior to visit.     ALLERGIES: No Known Allergies  FAMILY HISTORY: No family history on file.  SOCIAL  HISTORY: Social History   Socioeconomic History  . Marital status: Single    Spouse name: Not on file  . Number of children: Not on file  . Years of education: Not on file  . Highest education level: Not on file  Occupational History  . Not on file  Social Needs  . Financial resource strain: Not on file  . Food insecurity:    Worry: Not on file    Inability: Not on file  . Transportation needs:    Medical: Not on file    Non-medical: Not on file  Tobacco Use  . Smoking status: Never Smoker  . Smokeless tobacco: Never Used  Substance and Sexual Activity  . Alcohol use: No    Alcohol/week: 0.0 standard drinks  . Drug use: No  . Sexual activity: Not on file  Lifestyle  . Physical activity:    Days per week: Not on file    Minutes per session: Not on file  . Stress: Not on file  Relationships  . Social connections:    Talks on phone: Not on file    Gets together: Not on file    Attends religious service: Not on file    Active member of club or organization: Not on file    Attends meetings of clubs or organizations: Not on file    Relationship status: Not on file  . Intimate partner violence:    Fear of current or ex partner: Not on file    Emotionally abused: Not on file    Physically abused: Not on file    Forced sexual activity: Not on file  Other Topics Concern  . Not on file  Social History Narrative  . Not on file    REVIEW OF SYSTEMS: unable to obtain due to patient being non-verbal  PHYSICAL EXAM: Today's Vitals   10/07/18 0837  BP: 112/68  Pulse: (!) 104  SpO2: 98%  Weight: 134 lb (60.8 kg)   Body mass index is 18.69 kg/m. General: No acute distress Head:  Normocephalic/atraumatic Neck: supple, no full range of motion Heart:  Regular rate and rhythm Lungs:  Clear to auscultation bilaterally Skin/Extremities: No rash, no edema Neurological Exam: alert, tracks, non-verbal, does not follow commands (similar to prior). Cranial nerves: Pupils equal,  round, reactive to light.  Unable to do fundoscopy. Extraocular movements grossly intact with no nystagmus. +blink to threat bilaterally. No facial asymmetry. Motor: unable to do formal testing, moves all extremities symmetrically. Sensation: withdraws to touch. Gait narrow-based and steady, no ataxia. No tremors.  IMPRESSION: This is a 38 yo man with a history of static encephalopathy, non-verbal, does not follow commands at baseline, who had a witnessed convulsion last 04/25/15. On review of records, he had an ER visit in 2008 for questionable seizure with forehead laceration. Head CT unremarkable. He had an MRI at that time that was degraded by  motion but overall no significant abnormalities. He is easily agitated by touch and will not tolerate an EEG. He has been taking Depakote with no further seizures since August 2016. Staff is reporting more behavioral changes, we discussed either seeing his psychiatrist or increasing Depakote to 250mg  in AM, 500mg  in PM until he sees his Psychiatrist. Staff would like to increase dose now. Continue safety labs CBC, CMP annually. Follow-up in 1 year, they know to call for any changes.   Thank you for allowing me to participate in his care.  Please do not hesitate to call for any questions or concerns.  The duration of this appointment visit was 15 minutes of face-to-face time with the patient.  Greater than 50% of this time was spent in counseling, explanation of diagnosis, planning of further management, and coordination of care.   Patrcia Dolly, M.D.   CC: Dr. Cyndia Bent

## 2018-10-07 NOTE — Patient Instructions (Signed)
1. Increase Depakote 250mg : Take 1 tab in AM, 2 tabs in PM and see if this helps with behavior. Discuss further adjustments with Psychiatry  2. Follow-up in 1 year, call for any changes  Seizure Precautions: 1. If medication has been prescribed for you to prevent seizures, take it exactly as directed.  Do not stop taking the medicine without talking to your doctor first, even if you have not had a seizure in a long time.   2. Avoid activities in which a seizure would cause danger to yourself or to others.  Don't operate dangerous machinery, swim alone, or climb in high or dangerous places, such as on ladders, roofs, or girders.  Do not drive unless your doctor says you may.  3. If you have any warning that you may have a seizure, lay down in a safe place where you can't hurt yourself.    4.  No driving for 6 months from last seizure, [place].   Please refer to the following link on the [employer]ile[employer]'s website for more information: http://www.epilepsyfoundation.org/answerplace/Social/driving/drivingu.cfm   5.  Maintain good sleep hygiene. Avoid alcohol.  6.  Contact your doctor if you have any problems that may be related to the medicine you are taking.  7.  Call 911 and bring the patient back to the ED if:        A.  The seizure lasts longer than 5 minutes.       B.  The patient doesn't awaken shortly after the seizure  C.  The patient has new problems such as difficulty seeing, speaking or moving  D.  The patient was injured during the seizure  E.  The patient has a temperature over 102 F (39C)  F.  The patient vomited and now is having trouble breathing

## 2018-11-03 ENCOUNTER — Emergency Department (HOSPITAL_BASED_OUTPATIENT_CLINIC_OR_DEPARTMENT_OTHER)
Admission: EM | Admit: 2018-11-03 | Discharge: 2018-11-03 | Disposition: A | Discharge status: Home / Self Care | Payer: Medicaid Other | Attending: Emergency Medicine | Admitting: Emergency Medicine

## 2018-11-03 ENCOUNTER — Emergency Department (HOSPITAL_BASED_OUTPATIENT_CLINIC_OR_DEPARTMENT_OTHER): Payer: Medicaid Other

## 2018-11-03 ENCOUNTER — Other Ambulatory Visit: Payer: Self-pay

## 2018-11-03 ENCOUNTER — Encounter (HOSPITAL_BASED_OUTPATIENT_CLINIC_OR_DEPARTMENT_OTHER): Payer: Self-pay | Admitting: Emergency Medicine

## 2018-11-03 DIAGNOSIS — Z79899 Other long term (current) drug therapy: Secondary | ICD-10-CM | POA: Insufficient documentation

## 2018-11-03 DIAGNOSIS — R509 Fever, unspecified: Secondary | ICD-10-CM | POA: Insufficient documentation

## 2018-11-03 DIAGNOSIS — F840 Autistic disorder: Secondary | ICD-10-CM | POA: Diagnosis not present

## 2018-11-03 DIAGNOSIS — J111 Influenza due to unidentified influenza virus with other respiratory manifestations: Secondary | ICD-10-CM | POA: Diagnosis not present

## 2018-11-03 DIAGNOSIS — F79 Unspecified intellectual disabilities: Secondary | ICD-10-CM | POA: Insufficient documentation

## 2018-11-03 DIAGNOSIS — R531 Weakness: Secondary | ICD-10-CM | POA: Diagnosis present

## 2018-11-03 DIAGNOSIS — R6889 Other general symptoms and signs: Secondary | ICD-10-CM

## 2018-11-03 MED ORDER — ACETAMINOPHEN 325 MG PO TABS
650.0000 mg | ORAL_TABLET | Freq: Once | ORAL | Status: DC
Start: 2018-11-03 — End: 2018-11-03

## 2018-11-03 MED ORDER — ACETAMINOPHEN 160 MG/5ML PO SOLN
650.0000 mg | Freq: Once | ORAL | Status: AC
  Administered 2018-11-03: 650 mg via ORAL

## 2018-11-03 MED ORDER — ACETAMINOPHEN 160 MG/5ML PO SUSP
ORAL | Status: AC
  Filled 2018-11-03: qty 25

## 2018-11-03 NOTE — ED Triage Notes (Signed)
Reports cough and chills with weakness since Monday.  Reports fidgeting more than normal

## 2018-11-03 NOTE — Discharge Instructions (Addendum)
Would recommend Tylenol at least 650 mg every 6 hours for the fever.  Would encourage p.o. intake particularly fluids with sugar.  Try to avoid caffeine if possible.  Get him to try to drink a small amount like every 20 minutes.  Chest x-ray here today was negative for pneumonia.  Return for any new or worse symptoms.  As we discussed work-up somewhat limited due to him not wanting certain things done.  Oxygen saturation was 100%.  Symptoms are flulike in nature.  Possible could be the influenza.  But he is out of the window for Tamiflu treatment.

## 2018-11-03 NOTE — ED Provider Notes (Signed)
[facility] EMERGENCY DEPARTMENT Provider Note   CSN: [id number] Arrival date & time: 11/03/18  1226     History   Chief Complaint Chief Complaint  Patient presents with  . Weakness    HPI [name] is a 38 y.o. male.  Patient has MR and is autistic.  Patient has had congestion and some cough decreased p.o. intake since Monday.  Also fever.  No nausea vomiting or diarrhea.  Patient has wanted to pace a lot.  Also a little more difficult to redirect his behavior.     Past Medical History:  Diagnosis Date  . Acne   . ADHD (attention deficit hyperactivity disorder)   . Anxiety   . Autism   . Mental retardation   . Seasonal allergies   . Seizures (HCC)   . Vitamin D deficiency     Patient Active Problem List   Diagnosis Date Noted  . ADHD (attention deficit hyperactivity disorder) 10/08/2017  . Autism 10/08/2017  . Profound mental retardation 10/08/2017  . Vitamin D deficiency 10/08/2017  . 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 Anxiety 06/03/2011  . Environmental allergies 06/03/2011    Past Surgical History:  Procedure Laterality Date  . DENTAL RESTORATION/EXTRACTION WITH X-RAY N/A 07/18/2013   Procedure: CLEANING WITH XRAYS DENTAL RESTORATION AND EXTRACTION ;  Surgeon: Esaw Dace., DDS;  Location: [facility] OR;  Service: Oral Surgery;  Laterality: N/A;  Recall exam, Full mouth x-rays, Extraction teeth numbers 5, 6, 3,73,42,87,68 Glass Ionomer #26 (D,F)   . DENTAL RESTORATION/EXTRACTION WITH X-RAY N/A 05/21/2016   Procedure: DENTAL RESTORATION/EXTRACTION TEETH #4,8,9,18, and 31, COMPOSITE FILLING #52F WITH X-RAY;  Surgeon: Boneta Lucks, DDS;  Location: [facility] OR;  Service: Oral Surgery;  Laterality: N/A;        Home Medications    Prior to Admission medications   Medication Sig Start Date End Date Taking? Authorizing Provider  atomoxetine (STRATTERA) 40 MG capsule Take 40 mg by  mouth 2 (two) times daily.    [provider]  carbamide peroxide (DEBROX) 6.5 % otic solution Place 5 drops into both ears 2 (two) times a week. For cerumen impaction    [provider]  divalproex (DEPAKOTE) 250 MG DR tablet Take 1 tab in AM, 2 tabs in PM 10/07/18   Van Clines, MD  LORazepam (ATIVAN) 0.5 MG tablet Take 0.5 mg by mouth 4 (four) times daily.    [provider]  Nutritional Supplements (ENSURE PO) Take 1 Can by mouth 2 (two) times daily between meals.    [provider]  paroxetine mesylate (PEXEVA) 20 MG tablet Take 20 mg by mouth daily.    [provider]    Family History History reviewed. No pertinent family history.  Social History Social History   Tobacco Use  . Smoking status: Never Smoker  . Smokeless tobacco: Never Used  Substance Use Topics  . Alcohol use: No    Alcohol/week: 0.0 standard drinks  . Drug use: No     Allergies   Patient has no known allergies.   Review of Systems Review of Systems  Unable to perform ROS: Psychiatric disorder     Physical Exam Updated Vital Signs BP 107/68 (BP Location: Right Arm)   Pulse (!) 136   Temp (!) 100.8 F (38.2 C) (Rectal)   Resp 20   Ht 1.753 m (5\' 9" )   Wt 58.9 kg   SpO2 100%   BMI  19.18 kg/m   Physical Exam Vitals signs and nursing note reviewed.  Constitutional:      General: He is not in acute distress.    Appearance: Normal appearance. He is well-developed.  HENT:     Head: Normocephalic and atraumatic.     Mouth/Throat:     Mouth: Mucous membranes are dry.  Eyes:     Extraocular Movements: Extraocular movements intact.     Conjunctiva/sclera: Conjunctivae normal.     Pupils: Pupils are equal, round, and reactive to light.  Neck:     Musculoskeletal: Normal range of motion and neck supple. No neck rigidity.  Cardiovascular:     Rate and Rhythm: Regular rhythm. Tachycardia present.     Heart sounds: No murmur.  Pulmonary:      Effort: Pulmonary effort is normal. No respiratory distress.     Breath sounds: Normal breath sounds. No wheezing or rales.  Abdominal:     General: Bowel sounds are normal.     Palpations: Abdomen is soft.     Tenderness: There is no abdominal tenderness.  Musculoskeletal: Normal range of motion.        General: No swelling.  Skin:    General: Skin is warm and dry.     Capillary Refill: Capillary refill takes less than 2 seconds.  Neurological:     General: No focal deficit present.     Mental Status: He is alert. Mental status is at baseline.      ED Treatments / Results  Labs (all labs ordered are listed, but only abnormal results are displayed) Labs Reviewed - No data to display  EKG None  Radiology Dg Chest 2 View  Result Date: 11/03/2018 CLINICAL DATA:  Cough and fever EXAM: CHEST - 2 VIEW COMPARISON:  None. FINDINGS: Lungs are clear. Heart size and pulmonary vascularity are normal. No adenopathy. No bone lesions. IMPRESSION: No edema or consolidation. Electronically Signed   By: Bretta BangWilliam  Woodruff III M.D.   On: 11/03/2018 15:04    Procedures Procedures (including critical care time)  Medications Ordered in ED Medications  acetaminophen (TYLENOL) solution 650 mg (650 mg Oral Given 11/03/18 1449)     Initial Impression / Assessment and Plan / ED Course  I have reviewed the triage vital signs and the nursing notes.  Pertinent labs & imaging results that were available during my care of the patient were reviewed by me and considered in my medical decision making (see chart for details).     Patient is absolutely refusing any IV labs or IV fluids.  Had long discussion with caretaker.  We opted to go with chest x-ray because he can get that done standing up is patient did not want to sit down or lay down.  Chest x-ray was negative for pneumonia.  Symptoms are consistent with a flulike illness.  Oxygen saturations are percent reassuring.  But he is tachycardic does  appear dehydrated and did have a temp 200.8.  We are were able to give him Tylenol he will take that with food.  He were able to get that in at his home they are able to do that as well.  Since no evidence of pneumonia probably flulike illness.  Out of window for Tamiflu treatment.  As we discussed we would prefer to have given him IV fluids and lab work but it would require completely putting him out and keeping him out in order to get that done.  It seemed as if maybe that was a  bit much at this point in time.  Caregiver agreed.  They will bring him back for any new or worse symptoms.  Made it clear to the caregiver it was not an ideal work-up but under the circumstances probably the best that could be done today.  Final Clinical Impressions(s) / ED Diagnoses   Final diagnoses:  Flu-like symptoms  Fever, unspecified fever cause    ED Discharge Orders    None       Vanetta Mulders, MD 11/03/18 1551

## 2018-11-03 NOTE — ED Notes (Signed)
Patient transported to X-ray, with care giver and ED Tech

## 2018-11-03 NOTE — ED Notes (Signed)
ED Provider at bedside. 

## 2018-11-03 NOTE — ED Notes (Addendum)
Pacing in room, does not follow instructions, 2 care givers at bedside , this is per normal, at times . Will not allow to be on cardiac monitor

## 2018-11-03 NOTE — ED Notes (Signed)
Pt agitated and pacing at time of discharge v/s. EDP aware of pt's HR. Pt tolerating Gatorade well. Pt's caregiver advised to push PO fluids on pt regularly.

## 2019-10-05 ENCOUNTER — Encounter: Payer: Self-pay | Admitting: Neurology

## 2019-10-06 ENCOUNTER — Telehealth (INDEPENDENT_AMBULATORY_CARE_PROVIDER_SITE_OTHER): Payer: Medicaid Other | Admitting: Neurology

## 2019-10-06 ENCOUNTER — Other Ambulatory Visit: Payer: Self-pay

## 2019-10-06 VITALS — Ht 69.0 in | Wt 129.0 lb

## 2019-10-06 DIAGNOSIS — G9349 Other encephalopathy: Secondary | ICD-10-CM | POA: Diagnosis not present

## 2019-10-06 DIAGNOSIS — G40309 Generalized idiopathic epilepsy and epileptic syndromes, not intractable, without status epilepticus: Secondary | ICD-10-CM

## 2019-10-06 MED ORDER — DIVALPROEX SODIUM 250 MG PO DR TAB
DELAYED_RELEASE_TABLET | ORAL | 3 refills | Status: DC
Start: 2019-10-06 — End: 2024-01-28

## 2019-10-06 NOTE — Progress Notes (Signed)
Telephone (Audio) Visit The purpose of this telephone visit is to provide medical care while limiting exposure to the novel coronavirus.    Consent was obtained for telephone visit and initiated by group home staff:  Yes.   Answered questions that patient had about telehealth interaction:  Yes.   I discussed the limitations, risks, security and privacy concerns of performing an evaluation and management service by telephone. I also discussed with the patient that there may be a patient responsible charge related to this service. The patient expressed understanding and agreed to proceed.  Pt location: Home Physician Location: office Name of referring provider:  Chesley Noon, MD I connected with .[name]'s group home staff on 10/06/2019 at  8:30 AM EST by telephone and verified that I am speaking with the correct person using two identifiers.  Pt MRN:  [id number] Pt DOB:  1981-09-19   History of Present Illness:  The patient had a telephone visit on 10/06/2019. He was last seen in the neurology clinic a year ago. Group home coordinator Ubaldo Glassing is present to provide information since [name] is non-verbal. He has been seizure-free since August 2016. Prior to this, he was in the in 2008 for possible seizure. MRI brain no acute changes. He does not like to be touched and would not tolerate an EEG. On his last visit, Depakote dose was increased for behaviors. He is on Depakote 250mg  1 tab in AM, 2 tabs in PM. Alexis reports this has done really well with behaviors. He has calmed down a lot. Appetite is "very great." They do hand over hand feeding. Sleep is good. No falls. No apparent side effects on medications. He also follows up with Psychiatry.   HPI: This is a 39 yo man with a history of static encephalopathy with intellectual disability, non-verbal, ADHD, anxiety, presenting for evaluation of seizures. He was in the ER last 04/25/15 after he had a witnessed convulsion at the group home.  Group home staff today witnessed the episode, he was sitting on the couch, then suddenly stiffened up and had a convulsion lasting about 3 minutes. It took 4-5 minutes to return to baseline. He was brought to [facility] ER where CBC, BMP, urinalysis were unremarkable.Glucose level 186. Staff today reports he was not eating much that morning, he usually can feed himself but would be messy, but that morning he was just sitting and appeared to be waiting to be fed, which is unusual for him. They deny any recent infections. He was back to baseline in the ER. He had a head CT which I personally reviewed, which did not show any acute changes.   On review of records, he was brought to the ER in August 2008 for report of a seizure. Per ER notes, he had seizure activity with laceration to the left eyebrow. Head CT had shown a small focus of high attenuation in the right temporal lobe, possible focal hemorrhagic contusion. A follow-up MRI brain was significantly degraded by motion, images obtained did not show any acute changes or significant abnormality. It was felt that seizure history was unclear, and he was not started on anti-seizure medication. According to ER notes, his mother came to the hospital and stated he did not have a seizure before to their knowledge. There is not much history available. According to group home staff, at baseline he is non-verbal, does not consistently follow commands, needing prompting to do things such as walk and sit. He can feed himself but is  messy. He needs help with dressing and bathing. He can get agitated and aggressive, hitting and grabbing, and takes Arivan 0.5mg  four times a day for this. Per ER notes, no missed doses.     Observations/Objective:   Vitals:   10/05/19 0924  Weight: 129 lb (58.5 kg)  Height: 5\' 9"  (1.753 m)   Patient is non-verbal and does not follow commands. Unable to do any exam via telephone.  Assessment and Plan:   This is a 39 yo man with a history of  static encephalopathy, non-verbal, does not follow commands at baseline, who had a witnessed convulsion last 04/25/15. On review of records, he had an ER visit in 2008 for questionable seizure with forehead laceration. Head CT unremarkable. He had an MRI at that time that was degraded by motion but overall no significant abnormalities. He is easily agitated by touch and will not tolerate an EEG. No seizures since August 2016. He is on Depakote 250mg  1 tab in AM, 2 tabs in PM which has also helped with behaviors. Continue 24/7 care. Continue safety labs CBC, CMP annually. Follow-up in 1 year, they know to call for any changes.    Follow Up Instructions:   -I discussed the assessment and treatment plan with the patient's group home coordinator. The patient's group home coordinator was provided an opportunity to ask questions and all were answered. The patient's group home coordinator agreed with the plan and demonstrated an understanding of the instructions.   The patient's group home coordinator was advised to call back or seek an in-person evaluation if the symptoms worsen or if the condition fails to improve as anticipated.  Total Time spent in visit with the patient was 5 minutes, of which 100% of the time was spent in counseling and/or coordinating care on the above.   Pt understands and agrees with the plan of care outlined.      September 2016, MD

## 2020-05-28 ENCOUNTER — Other Ambulatory Visit: Payer: Self-pay

## 2020-05-28 ENCOUNTER — Encounter (HOSPITAL_BASED_OUTPATIENT_CLINIC_OR_DEPARTMENT_OTHER): Payer: Self-pay | Admitting: *Deleted

## 2020-05-28 ENCOUNTER — Emergency Department (HOSPITAL_BASED_OUTPATIENT_CLINIC_OR_DEPARTMENT_OTHER): Payer: Medicaid Other

## 2020-05-28 ENCOUNTER — Emergency Department (HOSPITAL_BASED_OUTPATIENT_CLINIC_OR_DEPARTMENT_OTHER)
Admission: EM | Admit: 2020-05-28 | Discharge: 2020-05-28 | Disposition: A | Discharge status: Home / Self Care | Payer: Medicaid Other | Attending: Emergency Medicine | Admitting: Emergency Medicine

## 2020-05-28 DIAGNOSIS — R509 Fever, unspecified: Secondary | ICD-10-CM | POA: Insufficient documentation

## 2020-05-28 DIAGNOSIS — F840 Autistic disorder: Secondary | ICD-10-CM | POA: Diagnosis not present

## 2020-05-28 DIAGNOSIS — Z79899 Other long term (current) drug therapy: Secondary | ICD-10-CM | POA: Diagnosis not present

## 2020-05-28 DIAGNOSIS — Z20822 Contact with and (suspected) exposure to covid-19: Secondary | ICD-10-CM | POA: Diagnosis not present

## 2020-05-28 LAB — SARS CORONAVIRUS 2 BY RT PCR (HOSPITAL ORDER, PERFORMED IN ~~LOC~~ HOSPITAL LAB): SARS Coronavirus 2: NEGATIVE

## 2020-05-28 NOTE — ED Triage Notes (Signed)
Fever this am

## 2020-05-28 NOTE — Discharge Instructions (Signed)
You have been seen here for fever.  Exam and imaging all look reassuring.  I recommend taking Tylenol for fever control and ibuprofen for pain control.  You may take them every 6 hours, please follow dosing the back of bottle.  I want you to follow-up with your primary care doctor if symptoms worsen or if they do not fully resolve within 1 week.  I want to come back to the emergency department if you develop chest pain, shortness of breath, severe abdominal pain, uncontrolled nausea, vomiting, diarrhea as these symptoms require further evaluation management.

## 2020-05-28 NOTE — ED Notes (Signed)
Pt. Care taker reports to RN his temp has been taken and the Pt. Will not close his mouth. Caretaker at bedside reports that the Pt. Had temp taken at his group home and it was reported to him that the Pt. Temp was 105 x 3.  Pt. Does not close mouth when RN takes temp.. RN ask for assistance to do rectal temp.  So poss. More accurate temp could be taken.  Pt. Very uncooperative physically with rectal temp.  Axillary temp. Done on Pt. At 98.0 and Pt. Tolerated as well as he could.

## 2020-05-28 NOTE — ED Provider Notes (Signed)
[facility] EMERGENCY DEPARTMENT Provider Note   CSN: [id number] Arrival date & time: 05/28/20  1119     History Chief Complaint  Patient presents with  . Fever    [name] is a 39 y.o. male.  HPI HPI will be deferred due to a level 5 caveat of autism and being nonverbal.  The director of the group home where the patient lives was present and HPI was collected from him.  Patient has significant medical history of  ADHD, anxiety, autism, seasonal allergies.  He states that patient was at his counseling session this morning and the counselor there checked his temperature  and it was 101.5. they recommend that he come here for reevaluation.  He has not noticed any change in behavior, patient has been eating and drinking without  difficulty, having normal bowel movements.  He states that patient at baseline is unable to communicate and he has not noticed any decrease in his mental status.    Past Medical History:  Diagnosis Date  . Acne   . ADHD (attention deficit hyperactivity disorder)   . Anxiety   . Autism   . Mental retardation   . Seasonal allergies   . Seizures (HCC)   . Vitamin D deficiency     Patient Active Problem List   Diagnosis Date Noted  . ADHD (attention deficit hyperactivity disorder) 10/08/2017  . Autism 10/08/2017  . Profound mental retardation 10/08/2017  . Vitamin D deficiency 10/08/2017  . 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 Anxiety 06/03/2011  . Environmental allergies 06/03/2011    Past Surgical History:  Procedure Laterality Date  . DENTAL RESTORATION/EXTRACTION WITH X-RAY N/A 07/18/2013   Procedure: CLEANING WITH XRAYS DENTAL RESTORATION AND EXTRACTION ;  Surgeon: Esaw Dace., DDS;  Location: [facility] OR;  Service: Oral Surgery;  Laterality: N/A;  Recall exam, Full mouth x-rays, Extraction teeth numbers 5, 6, 8,29,56,21,30 Glass Ionomer #26 (D,F)   . DENTAL  RESTORATION/EXTRACTION WITH X-RAY N/A 05/21/2016   Procedure: DENTAL RESTORATION/EXTRACTION TEETH #4,8,9,18, and 31, COMPOSITE FILLING #31F WITH X-RAY;  Surgeon: Boneta Lucks, DDS;  Location: [facility] OR;  Service: Oral Surgery;  Laterality: N/A;       No family history on file.  Social History   Tobacco Use  . Smoking status: Never Smoker  . Smokeless tobacco: Never Used  Vaping Use  . Vaping Use: Never used  Substance Use Topics  . Alcohol use: No    Alcohol/week: 0.0 standard drinks  . Drug use: No    Home Medications Prior to Admission medications   Medication Sig Start Date End Date Taking? Authorizing Provider  atomoxetine (STRATTERA) 10 MG capsule Take 10 mg by mouth daily.    [provider]  atomoxetine (STRATTERA) 40 MG capsule Take 40 mg by mouth 2 (two) times daily.    [provider]  carbamide peroxide (DEBROX) 6.5 % otic solution Place 5 drops into both ears 2 (two) times a week. For cerumen impaction    [provider]  divalproex (DEPAKOTE) 250 MG DR tablet Take 1 tab in AM, 2 tabs in PM 10/06/19   Van Clines, MD  LORazepam (ATIVAN) 0.5 MG tablet Take 0.5 mg by mouth 4 (four) times daily.    [provider]  Nutritional Supplements (ENSURE PO) Take 1 Can by mouth 2 (two) times daily between meals.    [provider]  paroxetine mesylate (PEXEVA) 20 MG tablet Take  20 mg by mouth daily.    [provider]    Allergies    Patient has no known allergies.  Review of Systems   Review of Systems  Unable to perform ROS: Patient nonverbal    Physical Exam Updated Vital Signs BP 115/66 (BP Location: Right Arm)   Pulse 76   Temp 98.3 F (36.8 C) (Oral)   Resp 14   Ht 5\' 9"  (1.753 m)   Wt 58.5 kg   SpO2 100%   BMI 19.05 kg/m   Physical Exam Vitals and nursing note reviewed.  Constitutional:      General: He is not in acute distress.    Appearance: He is not ill-appearing.  HENT:     Head:  Normocephalic and atraumatic.     Right Ear: Tympanic membrane, ear canal and external ear normal. There is no impacted cerumen.     Left Ear: Tympanic membrane, ear canal and external ear normal. There is no impacted cerumen.     Nose: No congestion.     Mouth/Throat:     Mouth: Mucous membranes are moist.     Pharynx: Oropharynx is clear. No oropharyngeal exudate or posterior oropharyngeal erythema.  Eyes:     General: No scleral icterus. Cardiovascular:     Rate and Rhythm: Normal rate and regular rhythm.     Pulses: Normal pulses.     Heart sounds: No murmur heard.  No friction rub. No gallop.   Pulmonary:     Effort: No respiratory distress.     Breath sounds: No wheezing, rhonchi or rales.  Abdominal:     General: There is no distension.     Tenderness: There is no abdominal tenderness. There is no right CVA tenderness, left CVA tenderness or guarding.  Musculoskeletal:        General: No swelling or tenderness.     Right lower leg: No edema.     Left lower leg: No edema.  Skin:    General: Skin is warm and dry.     Capillary Refill: Capillary refill takes less than 2 seconds.     Findings: No rash.  Neurological:     Mental Status: He is alert. Mental status is at baseline.  Psychiatric:        Mood and Affect: Mood normal.     ED Results / Procedures / Treatments   Labs (all labs ordered are listed, but only abnormal results are displayed) Labs Reviewed  SARS CORONAVIRUS 2 BY RT PCR (HOSPITAL ORDER, PERFORMED IN [facility] LAB)    EKG None  Radiology DG Chest Port 1 View  Result Date: 05/28/2020 CLINICAL DATA:  Fever. EXAM: PORTABLE CHEST 1 VIEW COMPARISON:  11/03/2018. FINDINGS: Mediastinum and hilar structures normal. Heart size normal. Low lung volumes. No focal infiltrate. No pleural effusion or pneumothorax. Mild elevation left hemidiaphragm. No acute bony abnormality. IMPRESSION: No acute cardiopulmonary disease identified. Mild elevation left  hemidiaphragm. Electronically Signed   By: 11/05/2018  Register   On: 05/28/2020 15:08    Procedures Procedures (including critical care time)  Medications Ordered in ED Medications - No data to display  ED Course  I have reviewed the triage vital signs and the nursing notes.  Pertinent labs & imaging results that were available during my care of the patient were reviewed by me and considered in my medical decision making (see chart for details).    MDM Rules/Calculators/A&P  I have personally reviewed all imaging, labs and have interpreted them.  Patient here with fever.  On exam patient was alert, did not appear in acute distress, vital signs reassuring.  On exam lung sounds were clear bilaterally, abdomen was soft nontender to palpation, no pedal edema noted.  Ears were visualized no signs of infection is noted.  Throat visualized no exudates or erythema noted.  Will order Covid test and chest x-ray for further evaluation   Covid test negative and chest x-ray came back unremarkable.  I have low suspicion for systemic infection as patient was nontoxic-appearing, vital signs reassuring, no obvious source of infection noted on exam.  Low suspicion for cardiac abnormality as there is no signs of hypoperfusion or fluid overload noted on exam.  Low suspicion for intra-abdominal abnormality as abdomen was nontender to palpation, patient was tolerating p.o. without difficulty.  Due to well-appearing patient benign physical exam further lab work and imaging were not warranted.  My suspicion is temperature reading was incorrect when it was initially taken.  Patient appears to be resting comfortably showing no acute signs stress.  Vital signs have remained stable no indication for hospital admission.  I suspect initial temperature was incorrect but patient could have a viral URI.  Will recommend close follow-up with primary care and provide over-the-counter medications for fever  and pain control.  Caregiver was given at home care as well as strict return precautions.  He stated he understood and agreed to plan. Final Clinical Impression(s) / ED Diagnoses Final diagnoses:  Febrile illness    Rx / DC Orders ED Discharge Orders    None       Carroll Sage, PA-C 05/28/20 1554    Terrilee Files, MD 05/28/20 [phone]

## 2020-10-07 ENCOUNTER — Telehealth (INDEPENDENT_AMBULATORY_CARE_PROVIDER_SITE_OTHER): Payer: Medicaid Other | Admitting: Neurology

## 2020-10-07 ENCOUNTER — Other Ambulatory Visit: Payer: Self-pay

## 2020-10-07 DIAGNOSIS — Z5329 Procedure and treatment not carried out because of patient's decision for other reasons: Secondary | ICD-10-CM

## 2020-10-07 NOTE — Progress Notes (Signed)
No show for video visit on 10/07/2020. Sent email and called number listed, left voicemail to reschedule.

## 2021-02-13 ENCOUNTER — Other Ambulatory Visit: Payer: Self-pay

## 2021-02-13 ENCOUNTER — Telehealth (INDEPENDENT_AMBULATORY_CARE_PROVIDER_SITE_OTHER): Payer: Medicaid Other | Admitting: Neurology

## 2021-02-13 ENCOUNTER — Encounter: Payer: Self-pay | Admitting: Neurology

## 2021-02-13 DIAGNOSIS — G9349 Other encephalopathy: Secondary | ICD-10-CM | POA: Diagnosis not present

## 2021-02-13 DIAGNOSIS — G40309 Generalized idiopathic epilepsy and epileptic syndromes, not intractable, without status epilepticus: Secondary | ICD-10-CM

## 2021-02-13 NOTE — Patient Instructions (Signed)
Continue all medications. Follow-up in 1 year, call for any changes.

## 2021-02-13 NOTE — Progress Notes (Signed)
Pt home number is a business number that pt does not work at, Pt mothers number is no longer working,   [phone]
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
## 2021-07-08 ENCOUNTER — Ambulatory Visit: Payer: Medicaid Other | Admitting: Neurology

## 2022-02-05 ENCOUNTER — Emergency Department (HOSPITAL_BASED_OUTPATIENT_CLINIC_OR_DEPARTMENT_OTHER)
Admission: EM | Admit: 2022-02-05 | Discharge: 2022-02-05 | Disposition: A | Discharge status: Home / Self Care | Payer: Medicaid Other | Attending: Emergency Medicine | Admitting: Emergency Medicine

## 2022-02-05 ENCOUNTER — Other Ambulatory Visit: Payer: Self-pay

## 2022-02-05 ENCOUNTER — Encounter (HOSPITAL_BASED_OUTPATIENT_CLINIC_OR_DEPARTMENT_OTHER): Payer: Self-pay | Admitting: *Deleted

## 2022-02-05 ENCOUNTER — Ambulatory Visit (HOSPITAL_COMMUNITY)
Admission: EM | Admit: 2022-02-05 | Discharge: 2022-02-05 | Disposition: A | Discharge status: Home / Self Care | Payer: No Typology Code available for payment source | Attending: Emergency Medicine | Admitting: Emergency Medicine

## 2022-02-05 DIAGNOSIS — T7421XA Adult sexual abuse, confirmed, initial encounter: Secondary | ICD-10-CM | POA: Diagnosis present

## 2022-02-05 DIAGNOSIS — F840 Autistic disorder: Secondary | ICD-10-CM | POA: Insufficient documentation

## 2022-02-05 DIAGNOSIS — Z0441 Encounter for examination and observation following alleged adult rape: Secondary | ICD-10-CM | POA: Diagnosis present
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
## 2022-02-05 NOTE — ED Notes (Signed)
Pt d/c with [facility] from facility. No further questions at this time.
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
## 2022-02-05 NOTE — SANE Note (Signed)
On 02/05/2022, at approximately 2120 hours, the SANE/FNE Naval architect) consult was completed. The provider has been notified. Please contact the SANE/FNE nurse on call (listed in [place]) with any further concerns.   Correct Case Number:  [facility] Office Case Number:  [id number]
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
## 2022-02-05 NOTE — ED Notes (Signed)
Pt is unable to give vitals at this time, very upset and stormed out the room. Pt is nonverbal.
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
## 2022-04-01 IMAGING — DX DG CHEST 1V PORT
1 series · 1 of 1 positions shown · non-contrast
Comparison: 11/03/2018.

CLINICAL DATA: Fever.

EXAM:
PORTABLE CHEST 1 VIEW

[chest ap]
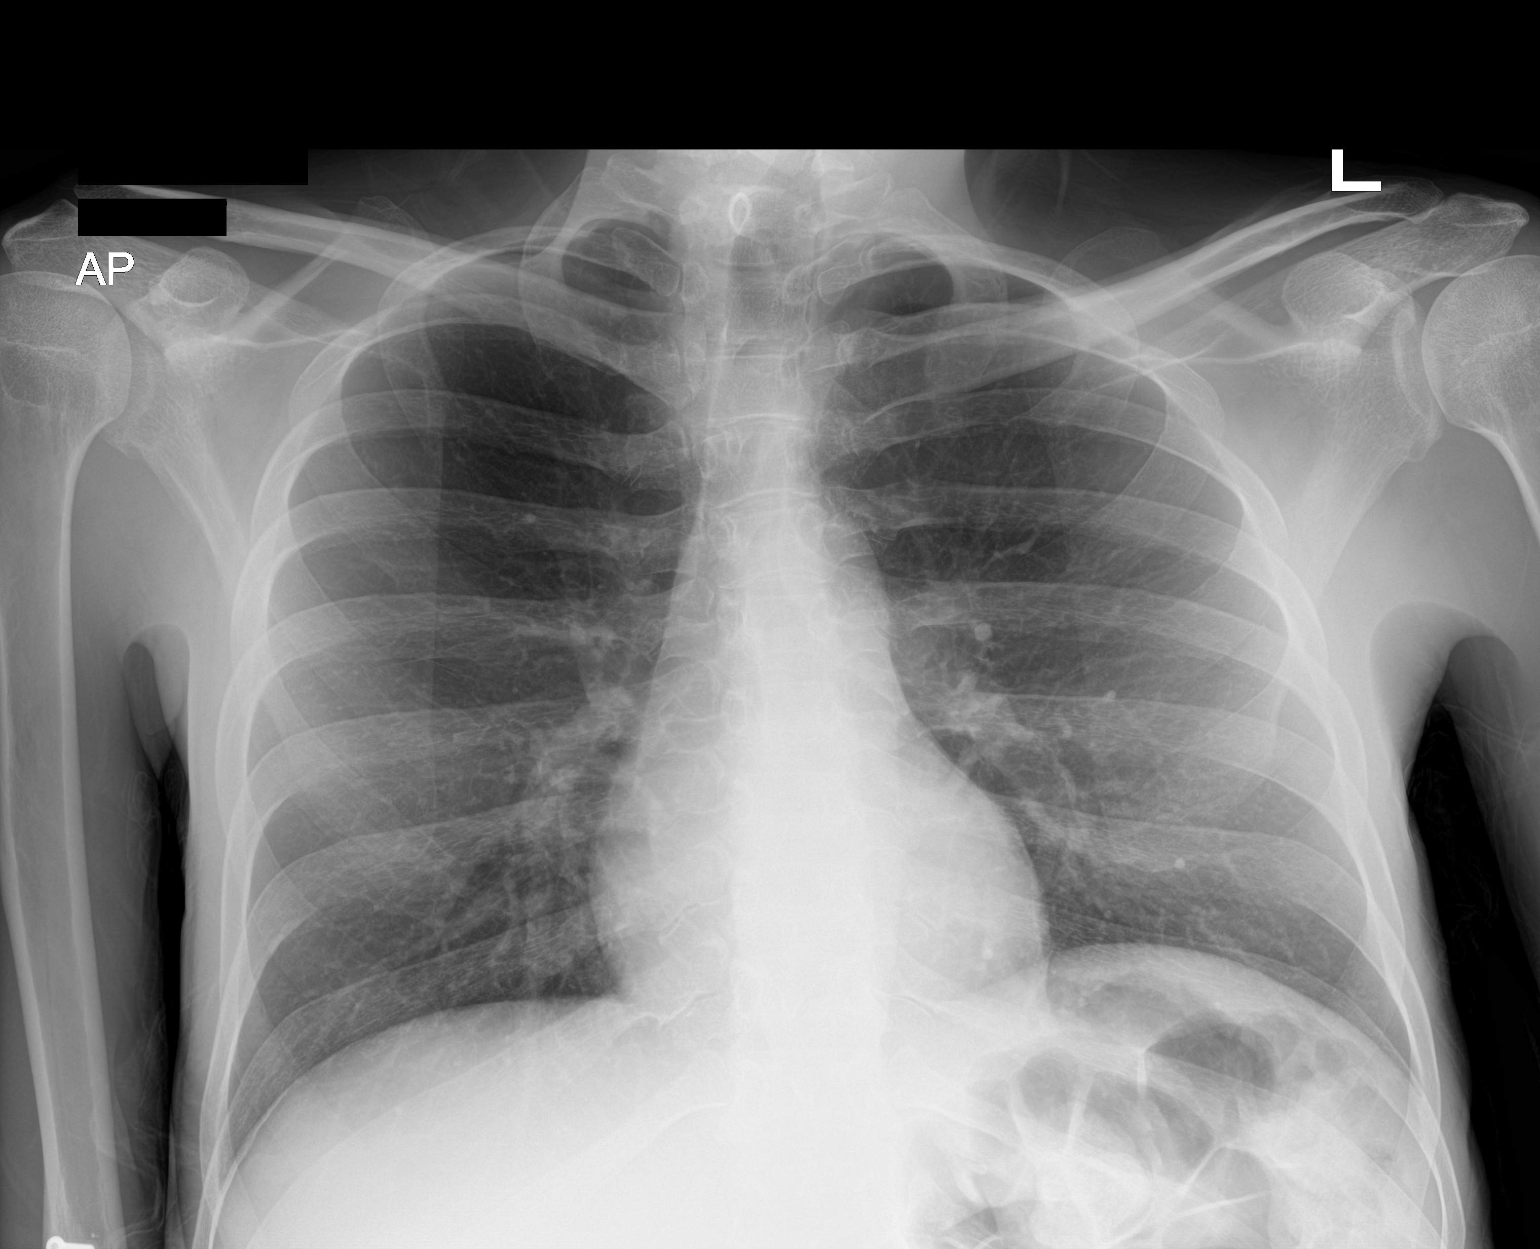

[1 of 1 positions shown; findings below may reference images not displayed]

FINDINGS: Mediastinum and hilar structures normal. Heart size normal. Low lung
volumes. No focal infiltrate. No pleural effusion or pneumothorax.
Mild elevation left hemidiaphragm. No acute bony abnormality.
IMPRESSION: No acute cardiopulmonary disease identified. Mild elevation left
hemidiaphragm.

## 2023-01-21 ENCOUNTER — Ambulatory Visit: Payer: Medicaid Other | Admitting: Neurology

## 2023-01-28 ENCOUNTER — Encounter: Payer: Self-pay | Admitting: Neurology

## 2023-01-28 ENCOUNTER — Ambulatory Visit (INDEPENDENT_AMBULATORY_CARE_PROVIDER_SITE_OTHER): Payer: Medicaid Other | Admitting: Neurology

## 2023-01-28 VITALS — BP 116/66 | HR 88 | Wt 143.6 lb

## 2023-01-28 DIAGNOSIS — G40309 Generalized idiopathic epilepsy and epileptic syndromes, not intractable, without status epilepticus: Secondary | ICD-10-CM

## 2023-01-28 NOTE — Progress Notes (Signed)
NEUROLOGY FOLLOW UP OFFICE NOTE  [name] [id number] 11-26-1980  HISTORY OF PRESENT ILLNESS: I had the pleasure of seeing [name] in follow-up in the neurology clinic on 01/28/2023.  The patient was last seen 2 years ago for seizures. He is accompanied by [name] from his group home who provides the history since [name] is non-verbal.  Records and images were personally reviewed where available.  Since his last visit, they continue to deny any seizures since August 2016 on Depakote DR 250mg  in AM, 500mg  in PM. No side effects. Prior to seizure in 2016, he was in the ER for a possible seizure in 2008. MRI brain on acute changes. He does not like to be touched and would not tolerate an EEG. Staff has no complaints, he may be a little more aggressive the past few weeks when they try to redirect him, but he is not violent. Sleep and appetite are good. No falls.   HPI: This is a 42 yo man with a history of static encephalopathy with intellectual disability, non-verbal, ADHD, anxiety, presenting for evaluation of seizures. He was in the ER last 04/25/15 after he had a witnessed convulsion at the group home. Group home staff today witnessed the episode, he was sitting on the couch, then suddenly stiffened up and had a convulsion lasting about 3 minutes. It took 4-5 minutes to return to baseline. He was brought to [facility] ER where CBC, BMP, urinalysis were unremarkable.Glucose level 186. Staff today reports he was not eating much that morning, he usually can feed himself but would be messy, but that morning he was just sitting and appeared to be waiting to be fed, which is unusual for him. They deny any recent infections. He was back to baseline in the ER. He had a head CT which I personally reviewed, which did not show any acute changes.   On review of records, he was brought to the ER in August 2008 for report of a seizure. Per ER notes, he had seizure activity with laceration to the left eyebrow. Head CT  had shown a small focus of high attenuation in the right temporal lobe, possible focal hemorrhagic contusion. A follow-up MRI brain was significantly degraded by motion, images obtained did not show any acute changes or significant abnormality. It was felt that seizure history was unclear, and he was not started on anti-seizure medication. According to ER notes, his mother came to the hospital and stated he did not have a seizure before to their knowledge. There is not much history available. According to group home staff, at baseline he is non-verbal, does not consistently follow commands, needing prompting to do things such as walk and sit. He can feed himself but is messy. He needs help with dressing and bathing. He can get agitated and aggressive, hitting and grabbing, and takes Arivan 0.5mg  four times a day for this. Per ER notes, no missed doses.    PAST MEDICAL HISTORY: Past Medical History:  Diagnosis Date   Acne    ADHD (attention deficit hyperactivity disorder)    Anxiety    Autism    Mental retardation    Seasonal allergies    Seizures (HCC)    Vitamin D deficiency     MEDICATIONS: Current Outpatient Medications on File Prior to Visit  Medication Sig Dispense Refill   atomoxetine (STRATTERA) 40 MG capsule Take 40 mg by mouth 2 (two) times daily.     atomoxetine (STRATTERA) 60 MG capsule Take 60 mg  by mouth every morning.     carbamide peroxide (DEBROX) 6.5 % otic solution Place 5 drops into both ears 2 (two) times a week. For cerumen impaction     divalproex (DEPAKOTE) 250 MG DR tablet Take 1 tab in AM, 2 tabs in PM 270 tablet 3   LORazepam (ATIVAN) 0.5 MG tablet Take 0.5 mg by mouth 4 (four) times daily.     Nutritional Supplements (ENSURE PO) Take 1 Can by mouth 2 (two) times daily between meals.     paroxetine mesylate (PEXEVA) 20 MG tablet Take 20 mg by mouth daily.     No current facility-administered medications on file prior to visit.    ALLERGIES: No Known  Allergies  FAMILY HISTORY: History reviewed. No pertinent family history.  SOCIAL HISTORY: Social History   Socioeconomic History   Marital status: Single    Spouse name: Not on file   Number of children: Not on file   Years of education: Not on file   Highest education level: Not on file  Occupational History   Not on file  Tobacco Use   Smoking status: Never   Smokeless tobacco: Never  Vaping Use   Vaping Use: Never used  Substance and Sexual Activity   Alcohol use: No    Alcohol/week: 0.0 standard drinks of alcohol   Drug use: No   Sexual activity: Not on file  Other Topics Concern   Not on file  Social History Narrative   Group home, [place]   Social Determinants of Health   Financial Resource Strain: Not on file  Food Insecurity: Not on file  Transportation Needs: Not on file  Physical Activity: Not on file  Stress: Not on file  Social Connections: Not on file  Intimate Partner Violence: Not on file     PHYSICAL EXAM: Vitals:   01/28/23 1100  BP: 116/66  Pulse: 88   General: No acute distress Head:  Normocephalic/atraumatic Skin/Extremities: No rash, no edema Neurological Exam: alert and awake. He started clapping hands together when he became a little agitated, then calmed down. Non-verbal, does not follow commands. Keeps arms flexed to his chest. Moves all extremities symmetrically x 4 at least anti-gravity. Able to ambulate without ataxia. No tremor.   IMPRESSION: This is a 42 yo man with a history of static encephalopathy, non-verbal, does not follow commands at baseline, who had a witnessed convulsion last 04/25/15. On review of records, he had an ER visit in 2008 for questionable seizure with forehead laceration. Head CT unremarkable. He had an MRI at that time that was degraded by motion but overall no significant abnormalities. He is easily agitated by touch and will not tolerate an EEG. He has been seizure-free since August 2016 on Depakote  250mg  in AM, 500mg  in PM, continue 24/7 care. Follow-up in 1 year, call for any changes.    Thank you for allowing me to participate in his care.  Please do not hesitate to call for any questions or concerns.    Patrcia Dolly, M.D.   CC: Dr. Cyndia Bent

## 2023-01-28 NOTE — Patient Instructions (Signed)
Good to see you doing well. Continue Depakote 250mg : take 1 tablet every morning, 2 tablets every evening. Follow-up in 1 year, call for any changes.    Seizure Precautions: 1. If medication has been prescribed for you to prevent seizures, take it exactly as directed.  Do not stop taking the medicine without talking to your doctor first, even if you have not had a seizure in a long time.   2. Avoid activities in which a seizure would cause danger to yourself or to others.  Don't operate dangerous machinery, swim alone, or climb in high or dangerous places, such as on ladders, roofs, or girders.  Do not drive unless your doctor says you may.  3. If you have any warning that you may have a seizure, lay down in a safe place where you can't hurt yourself.    4.  No driving for 6 months from last seizure, as per [facility].   Please refer to the following link on the [employer]ile[employer]'s website for more information: http://www.epilepsyfoundation.org/answerplace/Social/driving/drivingu.cfm   5.  Maintain good sleep hygiene.  6.  Contact your doctor if you have any problems that may be related to the medicine you are taking.  7.  Call 911 and bring the patient back to the ED if:        A.  The seizure lasts longer than 5 minutes.       B.  The patient doesn't awaken shortly after the seizure  C.  The patient has new problems such as difficulty seeing, speaking or moving  D.  The patient was injured during the seizure  E.  The patient has a temperature over 102 F (39C)  F.  The patient vomited and now is having trouble breathing

## 2023-07-23 ENCOUNTER — Emergency Department (HOSPITAL_BASED_OUTPATIENT_CLINIC_OR_DEPARTMENT_OTHER): Payer: MEDICAID

## 2023-07-23 ENCOUNTER — Encounter (HOSPITAL_BASED_OUTPATIENT_CLINIC_OR_DEPARTMENT_OTHER): Payer: Self-pay | Admitting: Urology

## 2023-07-23 ENCOUNTER — Emergency Department (HOSPITAL_BASED_OUTPATIENT_CLINIC_OR_DEPARTMENT_OTHER)
Admission: EM | Admit: 2023-07-23 | Discharge: 2023-07-23 | Disposition: A | Discharge status: Home / Self Care | Payer: MEDICAID | Attending: Emergency Medicine | Admitting: Emergency Medicine

## 2023-07-23 ENCOUNTER — Other Ambulatory Visit: Payer: Self-pay

## 2023-07-23 DIAGNOSIS — F840 Autistic disorder: Secondary | ICD-10-CM | POA: Diagnosis not present

## 2023-07-23 DIAGNOSIS — K122 Cellulitis and abscess of mouth: Secondary | ICD-10-CM | POA: Diagnosis not present

## 2023-07-23 DIAGNOSIS — M272 Inflammatory conditions of jaws: Secondary | ICD-10-CM

## 2023-07-23 DIAGNOSIS — R220 Localized swelling, mass and lump, head: Secondary | ICD-10-CM | POA: Diagnosis present

## 2023-07-23 LAB — CBC WITH DIFFERENTIAL/PLATELET
Abs Immature Granulocytes: 0.03 10*3/uL (ref 0.00–0.07)
Basophils Absolute: 0 10*3/uL (ref 0.0–0.1)
Basophils Relative: 0 %
Eosinophils Absolute: 0.1 10*3/uL (ref 0.0–0.5)
Eosinophils Relative: 1 %
HCT: 43.8 % (ref 39.0–52.0)
Hemoglobin: 14.5 g/dL (ref 13.0–17.0)
Immature Granulocytes: 0 %
Lymphocytes Relative: 21 %
Lymphs Abs: 1.7 10*3/uL (ref 0.7–4.0)
MCH: 29.8 pg (ref 26.0–34.0)
MCHC: 33.1 g/dL (ref 30.0–36.0)
MCV: 89.9 fL (ref 80.0–100.0)
Monocytes Absolute: 1 10*3/uL (ref 0.1–1.0)
Monocytes Relative: 12 %
Neutro Abs: 5.4 10*3/uL (ref 1.7–7.7)
Neutrophils Relative %: 66 %
Platelets: 187 10*3/uL (ref 150–400)
RBC: 4.87 MIL/uL (ref 4.22–5.81)
RDW: 15.8 % — ABNORMAL HIGH (ref 11.5–15.5)
WBC: 8.3 10*3/uL (ref 4.0–10.5)
nRBC: 0 % (ref 0.0–0.2)

## 2023-07-23 LAB — BASIC METABOLIC PANEL
Anion gap: 9 (ref 5–15)
BUN: 10 mg/dL (ref 6–20)
CO2: 27 mmol/L (ref 22–32)
Calcium: 8.7 mg/dL — ABNORMAL LOW (ref 8.9–10.3)
Chloride: 100 mmol/L (ref 98–111)
Creatinine, Ser: 0.52 mg/dL — ABNORMAL LOW (ref 0.61–1.24)
GFR, Estimated: >60 mL/min (ref >60)
Glucose, Bld: 107 mg/dL — ABNORMAL HIGH (ref 70–99)
Potassium: 3.7 mmol/L (ref 3.5–5.1)
Sodium: 136 mmol/L (ref 135–145)

## 2023-07-23 MED ORDER — LORAZEPAM 1 MG PO TABS
0.5000 mg | ORAL_TABLET | Freq: Once | ORAL | Status: AC
  Administered 2023-07-23: 0.5 mg via ORAL
  Filled 2023-07-23: qty 1

## 2023-07-23 MED ORDER — AMOXICILLIN-POT CLAVULANATE 875-125 MG PO TABS: 1.0000 | ORAL_TABLET | Freq: Two times a day (BID) | ORAL | 0 refills | Status: DC

## 2023-07-23 MED ORDER — IOHEXOL 300 MG/ML  SOLN
75.0000 mL | Freq: Once | INTRAMUSCULAR | Status: AC | PRN
  Administered 2023-07-23: 75 mL via INTRAVENOUS

## 2023-07-23 MED ORDER — SODIUM CHLORIDE 0.9 % IV SOLN
3.0000 g | Freq: Four times a day (QID) | INTRAVENOUS | Status: DC
  Administered 2023-07-23: 3 g via INTRAVENOUS

## 2023-07-23 NOTE — ED Notes (Signed)
D/c paperwork reviewed with pts caregiver at bedside, including prescriptions and follow up care.  All questions and/or concerns addressed at time of d/c.  No further needs expressed. . Pt verbalized understanding, ambulatory with caregiver to ED exit, NAD.

## 2023-07-23 NOTE — ED Triage Notes (Signed)
Per caregiver pt woke up with left sided facial swelling Pt nonverbal, no signs of pain noted  H/o of dental concerns per caregiver

## 2023-07-23 NOTE — ED Provider Notes (Signed)
[facility] [facility] Provider Note   CSN: [id number] Arrival date & time: 07/23/23  1134     History  Chief Complaint  Patient presents with   Facial Swelling    [name] is a 42 y.o. male history of autism, seizures, ADHD, intellectual disability presented for left-sided facial swelling that began this morning patient not up at 5 AM.  Patient is nonverbal was unable to provide history and so history is obtained by caregiver at the bedside along with [place] his caregiver overnight.  Both state patient went to bed without any issues and has not had any recent illnesses or dental procedures however woke up this morning left-sided facial swelling.  Patient was able to eat breakfast as usual this morning however was brought in as he does have history of dental procedures back in 2014 and 2017 but they wanted patient to be evaluated for.  Caregivers state that the patient has not had any fevers, nauseous vomiting, acting out of the norm  Home Medications Prior to Admission medications   Medication Sig Start Date End Date Taking? Authorizing Provider  amoxicillin-clavulanate (AUGMENTIN) 875-125 MG tablet Take 1 tablet by mouth every 12 (twelve) hours. 07/23/23  Yes Netta Corrigan, PA-C  atomoxetine (STRATTERA) 40 MG capsule Take 40 mg by mouth 2 (two) times daily.    [provider]  atomoxetine (STRATTERA) 60 MG capsule Take 60 mg by mouth every morning. 02/02/21   [provider]  carbamide peroxide (DEBROX) 6.5 % otic solution Place 5 drops into both ears 2 (two) times a week. For cerumen impaction    [provider]  divalproex (DEPAKOTE) 250 MG DR tablet Take 1 tab in AM, 2 tabs in PM 10/06/19   Van Clines, MD  LORazepam (ATIVAN) 0.5 MG tablet Take 0.5 mg by mouth 4 (four) times daily.    [provider]  Nutritional Supplements (ENSURE PO) Take 1 Can by mouth 2 (two) times daily between meals.     [provider]  paroxetine mesylate (PEXEVA) 20 MG tablet Take 20 mg by mouth daily.    [provider]      Allergies    Patient has no known allergies.    Review of Systems   Review of Systems  Physical Exam Updated Vital Signs BP 113/66   Pulse 99   Temp (!) 97.5 F (36.4 C) (Temporal)   Resp (!) 23   Ht 5\' 9"  (1.753 m)   Wt 65.1 kg   SpO2 97%   BMI 21.19 kg/m  Physical Exam Constitutional:      General: He is not in acute distress. HENT:     Head: Normocephalic and atraumatic.     Comments: Left-sided cheek swelling    Right Ear: Tympanic membrane, ear canal and external ear normal.     Left Ear: Tympanic membrane, ear canal and external ear normal.     Nose: Nose normal.     Mouth/Throat:     Comments: Patient was not opening his mouth so unable to evaluated oral cavity however does appear that there is left sided cheek swelling and when I press on it patient does not jump in pain or react; swelling is not erythematous, warm, fluctuant and there is no discharge No tenderness or swelling underneath the jaw Eyes:     Extraocular Movements: Extraocular movements intact.     Conjunctiva/sclera: Conjunctivae normal.     Pupils: Pupils are equal, round,  and reactive to light.  Cardiovascular:     Rate and Rhythm: Normal rate and regular rhythm.     Pulses: Normal pulses.     Heart sounds: Normal heart sounds.  Pulmonary:     Effort: Pulmonary effort is normal. No respiratory distress.     Breath sounds: Normal breath sounds.  Abdominal:     Palpations: Abdomen is soft.     Tenderness: There is no abdominal tenderness. There is no guarding or rebound.  Musculoskeletal:     Cervical back: Normal range of motion and neck supple. No tenderness.  Skin:    General: Skin is warm and dry.     Findings: No erythema.  Neurological:     Mental Status: He is alert.  Psychiatric:     Comments: At mental baseline     ED Results / Procedures / Treatments    Labs (all labs ordered are listed, but only abnormal results are displayed) Labs Reviewed  CBC WITH DIFFERENTIAL/PLATELET - Abnormal; Notable for the following components:      Result Value   RDW 15.8 (*)    All other components within normal limits  BASIC METABOLIC PANEL - Abnormal; Notable for the following components:   Glucose, Bld 107 (*)    Creatinine, Ser 0.52 (*)    Calcium 8.7 (*)    All other components within normal limits    EKG None  Radiology CT Maxillofacial W Contrast  Result Date: 07/23/2023 CLINICAL DATA:  left sided facial swelling EXAM: CT MAXILLOFACIAL WITH CONTRAST TECHNIQUE: Multidetector CT imaging of the maxillofacial structures was performed with intravenous contrast. Multiplanar CT image reconstructions were also generated. RADIATION DOSE REDUCTION: This exam was performed according to the departmental dose-optimization program which includes automated exposure control, adjustment of the mA and/or kV according to patient size and/or use of iterative reconstruction technique. CONTRAST:  75mL OMNIPAQUE IOHEXOL 300 MG/ML  SOLN COMPARISON:  None Available. FINDINGS: Osseous: No acute fracture.  TMJs are located. Orbits: Negative. No traumatic or inflammatory finding. Sinuses: Inferior left maxillary sinus mucosal thickening. Soft tissues: Approximately 5 mm peripherally enhancing fluid collection along the left maxilla (series 2, image 36), compatible with abscess. Extensive surrounding edema and soft tissue thickening, compatible with cellulitis. Other: The anterior left maxillary molar has a large carie and periapical lucency. Limited intracranial: No significant or unexpected finding. IMPRESSION: 1. Findings compatible with a 5 mm abscess along the left maxilla with extensive surrounding cellulitis. Findings are likely odontogenic given adjacent carious anterior left maxillary molar with periapical lucency. 2. Likely odontogenic inferior left maxillary sinus mucosal  thickening. Electronically Signed   By: Feliberto Harts M.D.   On: 07/23/2023 16:48    Procedures Procedures    Medications Ordered in ED Medications  Ampicillin-Sulbactam (UNASYN) 3 g in sodium chloride 0.9 % 100 mL IVPB (has no administration in time range)  iohexol (OMNIPAQUE) 300 MG/ML solution 75 mL (75 mLs Intravenous Contrast Given 07/23/23 1437)  LORazepam (ATIVAN) tablet 0.5 mg (0.5 mg Oral Given 07/23/23 1719)    ED Course/ Medical Decision Making/ A&P                                 Medical Decision Making Amount and/or Complexity of Data Reviewed Labs: ordered. Radiology: ordered.  Risk Prescription drug management.   Mason Jim 42 y.o. presented today for left cheek swelling. Working DDx that I considered at this time includes, but  not limited to, cellulitis, abscess, airway compromise, sinusitis.  R/o DDx: airway compromise, sinusitis: These are considered less likely due to history of present illness, physical exam, labs/imaging findings  Review of prior external notes: 02/05/2022 ED  Unique Tests and My Interpretation:  CBC: Unremarkable BMP: Unremarkable CT maxillofacial with contrast: 5 mm left abscess with extensive cellulitis most likely from odontogenic infection  Social Determinants of Health: Intellectual disability  Discussion with Independent Historian:  Caregivers, Brittney caregiver  Discussion of Management of Tests: None  Risk: Medium: prescription drug management  Risk Stratification Score: None  Staffed with Silverio Lay, MD  Plan: On exam patient was in no acute distress with stable vitals.  On exam patient does have left-sided cheek swelling with no concerning features however I was unable to fully assess patient as patient was not cooperating to open up his mouth.  Patient reportedly ate breakfast without issue however and has not been having any fevers or recent illnesses according to the caregivers.  Will get basic labs and scan  patient's face to rule out any abscesses and to further look into patient's left cheek swelling.  Patient does have left maxilla abscess that is 5 mm with extensive cellulitis found on the CT scan is most likely secondary to odontogenic causes.  Will give 1 dose of IV Unasyn here and discharged on Augmentin with dental follow-up.  At caregivers request will give Ativan as well as as this is the patient's seizure medication has not been able to take today.  Anticipate discharge once patient has received his Unasyn here.  Patient was given return precautions. Patient stable for discharge at this time.  Patient verbalized understanding of plan.  This chart was dictated using voice recognition software.  Despite best efforts to proofread,  errors can occur which can change the documentation meaning.         Final Clinical Impression(s) / ED Diagnoses Final diagnoses:  Abscess of maxilla    Rx / DC Orders ED Discharge Orders          Ordered    amoxicillin-clavulanate (AUGMENTIN) 875-125 MG tablet  Every 12 hours        07/23/23 1703              Netta Corrigan, PA-C 07/23/23 1736    Tegeler, Canary Brim, MD 07/24/23 1235

## 2023-07-23 NOTE — Discharge Instructions (Addendum)
Please follow-up with a dentist have attached your dentist to have your choosing.  Today your CT scan shows a of an abscess in the upper portion of the mouth coming from a dental infection and will need to be drained.  I have given you antibiotics you may take the meantime.  If symptoms change or worsen please return to ER.

## 2023-07-29 ENCOUNTER — Ambulatory Visit: Payer: Medicaid Other | Admitting: Neurology

## 2023-08-30 ENCOUNTER — Ambulatory Visit: Payer: Self-pay | Admitting: Dentistry

## 2023-08-30 DIAGNOSIS — F840 Autistic disorder: Secondary | ICD-10-CM

## 2023-09-01 ENCOUNTER — Encounter (HOSPITAL_COMMUNITY): Payer: Self-pay

## 2023-09-01 NOTE — Progress Notes (Addendum)
Surgical Instructions   Your procedure is scheduled on Thursday September 02, 2023. Report to [facility] Main Entrance "A" at 7:30 A.M., then check in with the Admitting office. Any questions or running late day of surgery: call [phone]  Questions prior to your surgery date: call [phone], Monday-Friday, 8am-4pm. If you experience any cold or flu symptoms such as cough, fever, chills, shortness of breath, etc. between now and your scheduled surgery, please notify us at the above number.     Remember:  Do not eat or drink after midnight the night before your surgery   Take these medicines the morning of surgery with A SIP OF WATER  divalproex (DEPAKOTE)  fluconazole (DIFLUCAN)  LORazepam (ATIVAN)   One week prior to surgery, STOP taking any Aspirin (unless otherwise instructed by your surgeon) Aleve, Naproxen, Ibuprofen, Motrin, Advil, Goody's, BC's, all herbal medications, fish oil, and non-prescription vitamins.                     Do NOT Smoke (Tobacco/Vaping) for 24 hours prior to your procedure.  If you use a CPAP at night, you may bring your mask/headgear for your overnight stay.   You will be asked to remove any contacts, glasses, piercing's, hearing aid's, dentures/partials prior to surgery. Please bring cases for these items if needed.    Patients discharged the day of surgery will not be allowed to drive home, and someone needs to stay with them for 24 hours.  SURGICAL WAITING ROOM VISITATION Patients may have no more than 2 support people in the waiting area - these visitors may rotate.   Pre-op nurse will coordinate an appropriate time for 1 ADULT support person, who may not rotate, to accompany patient in pre-op.  Children under the age of 49 must have an adult with them who is not the patient and must remain in the main waiting area with an adult.  If the patient needs to stay at the hospital during part of their recovery, the visitor guidelines for inpatient  rooms apply.  Please refer to the [facility] website for the visitor guidelines for any additional information.   If you received a COVID test during your pre-op visit  it is requested that you wear a mask when out in public, stay away from anyone that may not be feeling well and notify your surgeon if you develop symptoms. If you have been in contact with anyone that has tested positive in the last 10 days please notify you surgeon.      Pre-operative Dial soap nstructions   You can play a key role in reducing the risk of infection after surgery. Your skin needs to be as free of germs as possible. You can reduce the number of germs on your skin by washing with Dial soap before surgery.             TAKE A SHOWER THE NIGHT BEFORE SURGERY AND THE DAY OF SURGERY    Please keep in mind the following:  DO NOT shave, including legs and underarms, 48 hours prior to surgery.   You may shave your face before/day of surgery.  Place clean sheets on your bed the night before surgery Use a clean washcloth (not used since being washed) for each shower.  Dial soap  Shower Instructions:  Wash your face and private area with normal soap. If you choose to wash your hair, wash first with your normal shampoo.  After you use shampoo/soap, rinse your  hair and body thoroughly to remove shampoo/soap residue.        3.   Pat dry with a clean towel   4.  Put on clean pajamas    Additional instructions for the day of surgery: DO NOT APPLY any lotions, deodorants, cologne   Do not wear jewelry Do not bring valuables to the hospital. [facility] is not responsible for valuables/personal belongings. Put on clean/comfortable clothes.  Please brush your teeth.  Ask your nurse before applying any prescription medications to the skin.

## 2023-09-01 NOTE — Anesthesia Preprocedure Evaluation (Signed)
Anesthesia Evaluation  Patient identified by MRN, date of birth, ID band Patient awake    Reviewed: Allergy & Precautions, NPO status , Patient's Chart, lab work & pertinent test results  History of Anesthesia Complications Negative for: history of anesthetic complications  Airway Mallampati: II  TM Distance: >3 FB Neck ROM: Full    Dental  (+) Poor Dentition   Pulmonary neg pulmonary ROS   Pulmonary exam normal        Cardiovascular negative cardio ROS Normal cardiovascular exam     Neuro/Psych Seizures -,   Anxiety        GI/Hepatic negative GI ROS, Neg liver ROS,,,  Endo/Other  negative endocrine ROS    Renal/GU negative Renal ROS     Musculoskeletal negative musculoskeletal ROS (+)    Abdominal   Peds  (+) Developmental delay Hematology negative hematology ROS (+)   Anesthesia Other Findings   Reproductive/Obstetrics                              Anesthesia Physical Anesthesia Plan  ASA: 2  Anesthesia Plan: General   Post-op Pain Management: Minimal or no pain anticipated   Induction: Intravenous  PONV Risk Score and Plan: 2 and Treatment may vary due to age or medical condition, Ondansetron, Dexamethasone and Midazolam  Airway Management Planned: Oral ETT  Additional Equipment: None  Intra-op Plan:   Post-operative Plan: Extubation in OR  Informed Consent: I have reviewed the patients History and Physical, chart, labs and discussed the procedure including the risks, benefits and alternatives for the proposed anesthesia with the patient or authorized representative who has indicated his/her understanding and acceptance.     Dental advisory given and Consent reviewed with POA  Plan Discussed with: CRNA  Anesthesia Plan Comments: (PAT note written 09/01/2023 by Shonna Chock, PA-C.  )        Anesthesia Quick Evaluation

## 2023-09-01 NOTE — Progress Notes (Signed)
Anesthesia Chart Review: [name]  Case: [id number] Date/Time: 09/02/23 0945   Procedure: DENTAL RESTORATION/EXTRACTION WITH X-RAY   Anesthesia type: General   Pre-op diagnosis: DENTAL CARIES   [facility] ROOM 09 / [facility]   Surgeons: Joanna Hews, DMD       DISCUSSION: Patient is a 42 year old male scheduled for the above procedure. ED visit 07/23/23 for left maxillary abscess, likely otogenic, s/p Augmentin. H&P and well adult exam visit on 08/26/23 by [name], PA-C. EKG and labs done. She felt he was "stable for general anesthesia."  History includes never smoker, ADHD, anxiety, autism, intellectual disability, non-verbal, seizures, seasonal allergies. Prescribed Diflucan and nystatin powder on 08/26/23 for candidiasis of the groin.   He resides at [facility].  He had labs on 08/26/23 through [facility] including PSA, TSH, Lipid panel, A1c, CMP, CBC with diff (see Care Everywhere). Results include: TSH 2.040, A1c 5.9%, WBC 5.6, hemoglobin 15.7, hematocrit 48.9, platelet count 244, Leukos 72, BUN 11, creatinine 0.77, sodium 145, potassium 4.4, AST 16, ALT 16.      Anesthesia team to evaluate on the day of surgery.    VS:  BP Readings from Last 3 Encounters:  07/23/23 121/66  01/28/23 116/66  02/05/22 112/62   Pulse Readings from Last 3 Encounters:  07/23/23 97  01/28/23 88  02/05/22 75     PROVIDERS: Eartha Inch, MD is PCP    LABS: Most recent lab results in [facility] include: Lab Results  Component Value Date   WBC 8.3 07/23/2023   HGB 14.5 07/23/2023   HCT 43.8 07/23/2023   PLT 187 07/23/2023   GLUCOSE 107 (H) 07/23/2023   NA 136 07/23/2023   K 3.7 07/23/2023   CL 100 07/23/2023   CREATININE 0.52 (L) 07/23/2023   BUN 10 07/23/2023   CO2 27 07/23/2023     IMAGES: CT Maxillofacial 07/23/23: IMPRESSION: 1. Findings compatible with a 5 mm abscess along the left maxilla with extensive surrounding cellulitis. Findings  are likely odontogenic given adjacent carious anterior left maxillary molar with periapical lucency. 2. Likely odontogenic inferior left maxillary sinus mucosal thickening.   EKG: EKG 08/26/23 ([facility]): Per Narrative in [facility]: EKG reviewed.  Sinus Tachy at 109.  No ST elevation, depression or ectopy noted.     EKG 04/25/15: Sinus tachycardia Otherwise normal ECG No old tracing to compare Confirmed by GOLDSTON MD, SCOTT [id number]) on 04/25/2015 9:35:15 AM   CV: N/A  Past Medical History:  Diagnosis Date   Acne    ADHD (attention deficit hyperactivity disorder)    Anxiety    Autism    Mental retardation    Seasonal allergies    Seizures (HCC)    Vitamin D deficiency     Past Surgical History:  Procedure Laterality Date   DENTAL RESTORATION/EXTRACTION WITH X-RAY N/A 07/18/2013   Procedure: CLEANING WITH XRAYS DENTAL RESTORATION AND EXTRACTION ;  Surgeon: Esaw Dace., DDS;  Location: [facility] OR;  Service: Oral Surgery;  Laterality: N/A;  Recall exam, Full mouth x-rays, Extraction teeth numbers 5, 6, 7,17,21,22,27 Glass Ionomer #26 (D,F)    DENTAL RESTORATION/EXTRACTION WITH X-RAY N/A 05/21/2016   Procedure: DENTAL RESTORATION/EXTRACTION TEETH #4,8,9,18, and 31, COMPOSITE FILLING #63F WITH X-RAY;  Surgeon: Boneta Lucks, DDS;  [facility];  Service: Oral Surgery;  Laterality: N/A;    MEDICATIONS: No current facility-administered medications for this encounter.    atomoxetine (STRATTERA) 40 MG capsule   atomoxetine (STRATTERA) 60 MG  capsule   carbamide peroxide (DEBROX) 6.5 % otic solution   divalproex (DEPAKOTE) 250 MG DR tablet   docusate sodium (COLACE) 100 MG capsule   Emollient (EUCERIN) lotion   feeding supplement (BOOST HIGH PROTEIN) LIQD   fluconazole (DIFLUCAN) 100 MG tablet   LORazepam (ATIVAN) 0.5 MG tablet   nystatin (MYCOSTATIN/NYSTOP) powder   PARoxetine (PAXIL) 20 MG tablet   amoxicillin-clavulanate (AUGMENTIN) 875-125 MG tablet  He is not currently  taking Augmentin.   Shonna Chock, PA-C Surgical Short Stay/Anesthesiology [facility] Phone [phone] [facility] Phone [phone] 09/01/2023 11:24 AM

## 2023-09-01 NOTE — Progress Notes (Signed)
SDW call  Patient's nurse, Talmage Coin at [facility]  was given pre-op instructions over the phone, . She verbalized understanding of instructions provided.  A copy of pre-surgical instructions were also faxed to the facility at [phone]     PCP - Dr. Antony Haste Cardiologist -  Pulmonary:    PPM/ICD - denies Device Orders - na Rep Notified - na   Chest x-ray - na EKG -  na  Sleep Study/sleep apnea/CPAP: denies  Non-diabeetic   Blood Thinner Instructions: denies Aspirin Instructions:denies   ERAS Protcol - NPO  COVID TEST- No    Anesthesia review: Yes.  Seizures. MR, autistic, patient just started on Diflucan for rash around groin area.

## 2023-09-02 ENCOUNTER — Other Ambulatory Visit: Payer: Self-pay

## 2023-09-02 ENCOUNTER — Ambulatory Visit (HOSPITAL_COMMUNITY): Payer: MEDICAID | Admitting: Vascular Surgery

## 2023-09-02 ENCOUNTER — Ambulatory Visit (HOSPITAL_COMMUNITY)
Admission: RE | Admit: 2023-09-02 | Discharge: 2023-09-02 | Disposition: A | Discharge status: Home / Self Care | Payer: MEDICAID | Attending: Dentistry | Admitting: Dentistry

## 2023-09-02 ENCOUNTER — Encounter (HOSPITAL_COMMUNITY): Payer: Self-pay

## 2023-09-02 ENCOUNTER — Ambulatory Visit (HOSPITAL_BASED_OUTPATIENT_CLINIC_OR_DEPARTMENT_OTHER): Payer: MEDICAID | Admitting: Vascular Surgery

## 2023-09-02 ENCOUNTER — Encounter (HOSPITAL_COMMUNITY)
Admission: RE | Disposition: A | Discharge status: Home / Self Care | Payer: Self-pay | Source: Home / Self Care | Attending: Dentistry

## 2023-09-02 DIAGNOSIS — K029 Dental caries, unspecified: Secondary | ICD-10-CM | POA: Diagnosis present

## 2023-09-02 DIAGNOSIS — F909 Attention-deficit hyperactivity disorder, unspecified type: Secondary | ICD-10-CM | POA: Insufficient documentation

## 2023-09-02 DIAGNOSIS — F419 Anxiety disorder, unspecified: Secondary | ICD-10-CM | POA: Insufficient documentation

## 2023-09-02 DIAGNOSIS — F840 Autistic disorder: Secondary | ICD-10-CM | POA: Insufficient documentation

## 2023-09-02 DIAGNOSIS — F79 Unspecified intellectual disabilities: Secondary | ICD-10-CM | POA: Insufficient documentation

## 2023-09-02 HISTORY — PX: DENTAL RESTORATION/EXTRACTION WITH X-RAY: SHX5796

## 2023-09-02 SURGERY — DENTAL RESTORATION/EXTRACTION WITH X-RAY
Anesthesia: General

## 2023-09-02 MED ORDER — PHENYLEPHRINE HCL-NACL 20-0.9 MG/250ML-% IV SOLN: INTRAVENOUS | Status: DC | PRN

## 2023-09-02 MED ORDER — FENTANYL CITRATE (PF) 250 MCG/5ML IJ SOLN
INTRAMUSCULAR | Status: AC
  Filled 2023-09-02: qty 5

## 2023-09-02 MED ORDER — DEXMEDETOMIDINE HCL IN NACL 80 MCG/20ML IV SOLN
INTRAVENOUS | Status: DC | PRN
  Administered 2023-09-02: 4 ug via INTRAVENOUS
  Administered 2023-09-02: 8 ug via INTRAVENOUS

## 2023-09-02 MED ORDER — SUGAMMADEX SODIUM 200 MG/2ML IV SOLN
INTRAVENOUS | Status: DC | PRN
  Administered 2023-09-02: 400 mg via INTRAVENOUS

## 2023-09-02 MED ORDER — DROPERIDOL 2.5 MG/ML IJ SOLN: 0.6250 mg | Freq: Once | INTRAMUSCULAR | Status: DC | PRN

## 2023-09-02 MED ORDER — OXYMETAZOLINE HCL 0.05 % NA SOLN
NASAL | Status: AC
  Filled 2023-09-02: qty 30

## 2023-09-02 MED ORDER — 0.9 % SODIUM CHLORIDE (POUR BTL) OPTIME
TOPICAL | Status: DC | PRN
  Administered 2023-09-02: 1000 mL

## 2023-09-02 MED ORDER — MIDAZOLAM HCL 2 MG/2ML IJ SOLN
INTRAMUSCULAR | Status: AC
Start: 2023-09-02 — End: ?
  Filled 2023-09-02: qty 2

## 2023-09-02 MED ORDER — FENTANYL CITRATE (PF) 250 MCG/5ML IJ SOLN
INTRAMUSCULAR | Status: DC | PRN
  Administered 2023-09-02: 50 ug via INTRAVENOUS

## 2023-09-02 MED ORDER — ACETAMINOPHEN 10 MG/ML IV SOLN
INTRAVENOUS | Status: AC
  Filled 2023-09-02: qty 100

## 2023-09-02 MED ORDER — PHENYLEPHRINE HCL-NACL 20-0.9 MG/250ML-% IV SOLN
INTRAVENOUS | Status: DC | PRN
  Administered 2023-09-02: 40 ug via INTRAVENOUS

## 2023-09-02 MED ORDER — ORAL CARE MOUTH RINSE: 15.0000 mL | Freq: Once | OROMUCOSAL | Status: DC

## 2023-09-02 MED ORDER — CEFAZOLIN SODIUM-DEXTROSE 2-3 GM-%(50ML) IV SOLR
INTRAVENOUS | Status: DC | PRN
  Administered 2023-09-02: 2 g via INTRAVENOUS

## 2023-09-02 MED ORDER — PROPOFOL 500 MG/50ML IV EMUL
INTRAVENOUS | Status: DC | PRN
  Administered 2023-09-02: 100 ug/kg/min via INTRAVENOUS

## 2023-09-02 MED ORDER — MIDAZOLAM HCL 2 MG/2ML IJ SOLN
INTRAMUSCULAR | Status: DC | PRN
  Administered 2023-09-02: 2 mg via INTRAVENOUS

## 2023-09-02 MED ORDER — KETAMINE HCL 50 MG/5ML IJ SOSY
PREFILLED_SYRINGE | INTRAMUSCULAR | Status: AC
  Filled 2023-09-02: qty 5

## 2023-09-02 MED ORDER — ONDANSETRON HCL 4 MG/2ML IJ SOLN
INTRAMUSCULAR | Status: DC | PRN
  Administered 2023-09-02: 4 mg via INTRAVENOUS

## 2023-09-02 MED ORDER — LIDOCAINE 2% (20 MG/ML) 5 ML SYRINGE
INTRAMUSCULAR | Status: DC | PRN
  Administered 2023-09-02: 100 mg via INTRAVENOUS

## 2023-09-02 MED ORDER — PROPOFOL 10 MG/ML IV BOLUS
INTRAVENOUS | Status: DC | PRN
  Administered 2023-09-02: 150 mg via INTRAVENOUS

## 2023-09-02 MED ORDER — DEXAMETHASONE SODIUM PHOSPHATE 10 MG/ML IJ SOLN
INTRAMUSCULAR | Status: DC | PRN
  Administered 2023-09-02: 10 mg via INTRAVENOUS

## 2023-09-02 MED ORDER — ROCURONIUM BROMIDE 10 MG/ML (PF) SYRINGE
PREFILLED_SYRINGE | INTRAVENOUS | Status: DC | PRN
  Administered 2023-09-02: 10 mg via INTRAVENOUS
  Administered 2023-09-02: 30 mg via INTRAVENOUS

## 2023-09-02 MED ORDER — ACETAMINOPHEN 10 MG/ML IV SOLN: 1000.0000 mg | Freq: Once | INTRAVENOUS | Status: DC | PRN

## 2023-09-02 MED ORDER — FENTANYL CITRATE (PF) 100 MCG/2ML IJ SOLN: 25.0000 ug | INTRAMUSCULAR | Status: DC | PRN

## 2023-09-02 MED ORDER — CHLORHEXIDINE GLUCONATE 0.12 % MT SOLN: 15.0000 mL | Freq: Once | OROMUCOSAL | Status: DC

## 2023-09-02 MED ORDER — ACETAMINOPHEN 10 MG/ML IV SOLN
INTRAVENOUS | Status: DC | PRN
  Administered 2023-09-02: 1000 mg via INTRAVENOUS

## 2023-09-02 MED ORDER — SUCCINYLCHOLINE CHLORIDE 200 MG/10ML IV SOSY
PREFILLED_SYRINGE | INTRAVENOUS | Status: DC | PRN
  Administered 2023-09-02: 80 mg via INTRAVENOUS

## 2023-09-02 MED ORDER — KETOROLAC TROMETHAMINE 30 MG/ML IJ SOLN
INTRAMUSCULAR | Status: DC | PRN
  Administered 2023-09-02: 30 mg via INTRAVENOUS

## 2023-09-02 MED ORDER — LIDOCAINE-EPINEPHRINE 2 %-1:100000 IJ SOLN
INTRAMUSCULAR | Status: AC
  Filled 2023-09-02: qty 6.8

## 2023-09-02 MED ORDER — SODIUM CHLORIDE 0.9 % IV SOLN: INTRAVENOUS | Status: DC | PRN

## 2023-09-02 MED ORDER — LIDOCAINE HCL 2 % IJ SOLN
INTRAMUSCULAR | Status: DC | PRN
  Administered 2023-09-02: 6.8 mL

## 2023-09-02 SURGICAL SUPPLY — 22 items
CANISTER SUCT 3000ML PPV (MISCELLANEOUS) ×2 IMPLANT
COVER BACK TABLE 60X90IN (DRAPES) ×1 IMPLANT
COVER MAYO STAND STRL (DRAPES) ×1 IMPLANT
COVER SURGICAL LIGHT HANDLE (MISCELLANEOUS) ×1 IMPLANT
DRAPE HALF SHEET 40X57 (DRAPES) ×1 IMPLANT
GAUZE PACKING FOLDED 2 STR (GAUZE/BANDAGES/DRESSINGS) ×2 IMPLANT
GAUZE SPONGE 4X4 16PLY XRAY LF (GAUZE/BANDAGES/DRESSINGS) ×2 IMPLANT
GLOVE BIO SURGEON STRL SZ 6.5 (GLOVE) ×1 IMPLANT
GOWN STRL REUS W/ TWL LRG LVL3 (GOWN DISPOSABLE) ×4 IMPLANT
KIT BASIN OR (CUSTOM PROCEDURE TRAY) ×1 IMPLANT
KIT TURNOVER KIT B (KITS) ×2 IMPLANT
NDL DENTAL 27 LONG (NEEDLE) ×1 IMPLANT
NEEDLE DENTAL 27 LONG (NEEDLE) ×1 IMPLANT
PAD ARMBOARD 7.5X6 YLW CONV (MISCELLANEOUS) ×2 IMPLANT
SPONGE SURGIFOAM ABS GEL SZ50 (HEMOSTASIS) IMPLANT
SUT CHROMIC 3 0 PS 2 (SUTURE) ×1 IMPLANT
SYR BULB IRRIG 60ML STRL (SYRINGE) ×2 IMPLANT
TOOTHBRUSH ADULT (PERSONAL CARE ITEMS) ×1 IMPLANT
TOWEL GREEN STERILE (TOWEL DISPOSABLE) ×1 IMPLANT
TUBE CONNECTING 12X1/4 (SUCTIONS) ×1 IMPLANT
WATER TABLETS ICX (MISCELLANEOUS) ×2 IMPLANT
YANKAUER SUCT BULB TIP NO VENT (SUCTIONS) ×1 IMPLANT

## 2023-09-02 NOTE — Op Note (Signed)
[facility]  09/02/2023 [name] [id number]  Preop DX: Dental caries/behavior management issues due to intellectual disabilities/developmental disabilities. Dental Care provided in OR for medically necessary treatment.  Surgeon: Joanna Hews, DMD  Assistant: April Riggsbee and hospital staff.  Anesthesia: General  Procedure: The patient was brought into the operating room and placed on the table in a supine position.  General anesthesia was administered via nasal intubation.  The patient was prepped and draped in the usual manner for an intra-oral general dentistry procedure. The oropharynx was suctioned and a moistened oropharyngeal throat pack was placed.    A full intra-oral exam including all hard and soft tissues was performed.  Type of Exam: Recall   Soft Tissue Exam: Floor of the mouth: Normal Buccal mucosa: Normal Soft palate: Normal Hard palate: Normal Tongue: Normal Gingival:  Inflammed Frenum: Normal  Hard tissue exam:  Present: # 1, 2, 12, 13, 15, 19, 20, 28, 29, 30, 32  Missing: # 3 - 11, 14, 16, 17-18, 21-27, 31 Radiographic findings decay: # 1, 2 12, 13, 19, 20, 28, 29,  Radiographic findings abscess: # 1,2, 12,13, 19, 20,   Full mouth series of digital radiographs taken and reviewed. A comprehensive treatment plan was developed.    Routine extractions were accomplished with simple elevation and use of forceps.    All surgical sites were irrigated with copious amounts of saline. Gel foam was placed in the sockets and hemostasis established with firm pressure. Surgical sites were closed with 3-0 Chromic sutures.   Local Anes:Lidocaine 2% with 1:100,000 epinephrine 6.8 mls The estimated blood loss was 50 mls.   Upon completion of all procedures the oropharynx was irrigated of all debris. Mouth was suctioned dry and a posterior throat pack was carefully removed with constant suction. Hemostasis was established and a gauze pack was placed as an  intraoral pressure dressing. After spontaneous respirations the patient was extubated and transported to the Post-Anesthesia care unit in awake but in a sedated condition. The patient tolerated the procedure well and without complications.  An explanation of procedures and extractions were given to caregiver.  Operative Procedures:  Extractions completed: # 1, 2 12, 13, 15, 19, 20, 28, 29, 30, 32    Postoperative Meds:  Ibuprofen 600mg  and tylenol 500mg  every 6 hours if needed for pain.  Postoperative Instructions: Extraction sheet signed and given to patient representative.    Joanna Hews, DMD

## 2023-09-02 NOTE — Brief Op Note (Signed)
09/02/2023  12:34 PM  PATIENT:  [name]  42 y.o. male  PRE-OPERATIVE DIAGNOSIS:  DENTAL CARIES  POST-OPERATIVE DIAGNOSIS:  DENTAL CARIES  PROCEDURE:  Procedure(s): DENTAL EXTRACTION TOOTH 1, 2 12, 13, 15, 19, 20, 28, 29, 30, AND 32 WITH X-RAY (N/A)  SURGEON:  Surgeons and Role:    * Joanna Hews, DMD - Primary  PHYSICIAN ASSISTANT:   ASSISTANTS: April Riggsbee   ANESTHESIA:   general  EBL:  50ml  BLOOD ADMINISTERED:none  DRAINS: none   LOCAL MEDICATIONS USED:  LIDOCAINE  and Amount: 6.8 ml  SPECIMEN:  No Specimen  DISPOSITION OF SPECIMEN:  N/A  COUNTS:  YES  TOURNIQUET:  * No tourniquets in log *  DICTATION: .Note written in EPIC  PLAN OF CARE: Discharge to home after PACU  PATIENT DISPOSITION:  PACU - hemodynamically stable.   Delay start of Pharmacological VTE agent (>24hrs) due to surgical blood loss or risk of bleeding: not applicable

## 2023-09-02 NOTE — H&P (Signed)
Aaron&P reviewed. Stable for surgery [name] [name], DMD  

## 2023-09-02 NOTE — Anesthesia Postprocedure Evaluation (Signed)
Anesthesia Post Note  Patient: ESPER TRUELOCK  Procedure(s) Performed: DENTAL EXTRACTION TOOTH 1, 2 12, 13, 15, 19, 20, 28, 29, 30, AND 32 WITH X-RAY     Patient location during evaluation: PACU Anesthesia Type: General Level of consciousness: awake and alert Pain management: pain level controlled Vital Signs Assessment: post-procedure vital signs reviewed and stable Respiratory status: spontaneous breathing, nonlabored ventilation and respiratory function stable Cardiovascular status: blood pressure returned to baseline Postop Assessment: no apparent nausea or vomiting Anesthetic complications: no   No notable events documented.  Last Vitals:  Vitals:   09/02/23 1315 09/02/23 1330  BP: 107/63 109/79  Pulse: 78 79  Resp: 18 13  Temp:  (!) 36.2 C  SpO2: 100% 97%    Last Pain:  Vitals:   09/02/23 1251  TempSrc:   PainSc: Asleep   Pain Goal:                   Shanda Howells

## 2023-09-02 NOTE — Transfer of Care (Signed)
Immediate Anesthesia Transfer of Care Note  Patient: [name]  Procedure(s) Performed: DENTAL EXTRACTION TOOTH 1, 2 12, 13, 15, 19, 20, 28, 29, 30, AND 32 WITH X-RAY  Patient Location: PACU  Anesthesia Type:General  Level of Consciousness: sedated  Airway & Oxygen Therapy: Patient Spontanous Breathing and Patient connected to face mask oxygen  Post-op Assessment: Report given to RN and Post -op Vital signs reviewed and stable  Post vital signs: Reviewed and stable  Last Vitals:  Vitals Value Taken Time  BP 105/61 09/02/23 1250  Temp    Pulse 74 09/02/23 1253  Resp 14 09/02/23 1253  SpO2 100 % 09/02/23 1253  Vitals shown include unfiled device data.  Last Pain:  Vitals:   09/02/23 0931  TempSrc: Oral         Complications: No notable events documented.

## 2023-09-02 NOTE — Anesthesia Procedure Notes (Signed)
Procedure Name: Intubation Date/Time: 09/02/2023 11:13 AM  Performed by: Jimmey Ralph, CRNAPre-anesthesia Checklist: Patient identified, Emergency Drugs available, Suction available and Patient being monitored Patient Re-evaluated:Patient Re-evaluated prior to induction Oxygen Delivery Method: Circle System Utilized Preoxygenation: Pre-oxygenation with 100% oxygen Induction Type: Combination inhalational/ intravenous induction and Rapid sequence Ventilation: Mask ventilation without difficulty Laryngoscope Size: Glidescope Grade View: Grade I Nasal Tubes: Nasal Rae, Nasal prep performed and Magill forceps- large, utilized Tube size: 7.0 mm Number of attempts: 1 Placement Confirmation: ETT inserted through vocal cords under direct vision, positive ETCO2 and breath sounds checked- equal and bilateral Secured at: 28 cm Tube secured with: Tape Dental Injury: Teeth and Oropharynx as per pre-operative assessment  Comments: Red rubber all purpose urethral catheter 16 FR used to guide nasal rae ETT through nasopharnyx s trauma or bleeding.

## 2023-09-03 ENCOUNTER — Encounter (HOSPITAL_COMMUNITY): Payer: Self-pay | Admitting: Dentistry

## 2023-10-21 ENCOUNTER — Encounter: Payer: Self-pay | Admitting: Neurology

## 2024-01-28 ENCOUNTER — Ambulatory Visit (INDEPENDENT_AMBULATORY_CARE_PROVIDER_SITE_OTHER): Payer: MEDICAID | Admitting: Neurology

## 2024-01-28 ENCOUNTER — Encounter: Payer: Self-pay | Admitting: Neurology

## 2024-01-28 DIAGNOSIS — G40309 Generalized idiopathic epilepsy and epileptic syndromes, not intractable, without status epilepticus: Secondary | ICD-10-CM

## 2024-01-28 MED ORDER — DIVALPROEX SODIUM 250 MG PO DR TAB: DELAYED_RELEASE_TABLET | ORAL | 4 refills | Status: DC

## 2024-01-28 NOTE — Progress Notes (Signed)
 NEUROLOGY FOLLOW UP OFFICE NOTE  [name] [id number] 11-30-1980  HISTORY OF PRESENT ILLNESS: I had the pleasure of seeing [name] in follow-up in the neurology clinic on 01/28/2024.  The patient was last seen a year ago for seizures. He is accompanied by group home staff Sherlie Distance who has known him for 10 years and supplies the history as [name] is non-verbal. Since his last visit, staff denies any seizures since August 2016. Prior to seizure in 2016, he was in the ER for a possible seizure in 2008. MRI brain on acute changes. He does not like to be touched and would not tolerate an EEG. He is on Depakote  DR 250mg  in AM, 500mg  in PM for seizure prophylaxis. He is also on a scheduled dose of lorazepam  0.5mg  four times a day. Sherlie Distance reports behaviors are manageable. He is sleeping and eating okay, he needs assistance with meals. No falls.  HPI: This is a 43 yo man with a history of static encephalopathy with intellectual disability, non-verbal, ADHD, anxiety, presenting for evaluation of seizures. He was in the ER last 04/25/15 after he had a witnessed convulsion at the group home. Group home staff today witnessed the episode, he was sitting on the couch, then suddenly stiffened up and had a convulsion lasting about 3 minutes. It took 4-5 minutes to return to baseline. He was brought to [facility] ER where CBC, BMP, urinalysis were unremarkable.Glucose level 186. Staff today reports he was not eating much that morning, he usually can feed himself but would be messy, but that morning he was just sitting and appeared to be waiting to be fed, which is unusual for him. They deny any recent infections. He was back to baseline in the ER. He had a head CT which I personally reviewed, which did not show any acute changes.   On review of records, he was brought to the ER in August 2008 for report of a seizure. Per ER notes, he had seizure activity with laceration to the left eyebrow. Head CT had shown a small focus of  high attenuation in the right temporal lobe, possible focal hemorrhagic contusion. A follow-up MRI brain was significantly degraded by motion, images obtained did not show any acute changes or significant abnormality. It was felt that seizure history was unclear, and he was not started on anti-seizure medication. According to ER notes, his mother came to the hospital and stated he did not have a seizure before to their knowledge. There is not much history available. According to group home staff, at baseline he is non-verbal, does not consistently follow commands, needing prompting to do things such as walk and sit. He can feed himself but is messy. He needs help with dressing and bathing. He can get agitated and aggressive, hitting and grabbing, and takes Arivan 0.5mg  four times a day for this. Per ER notes, no missed doses.   PAST MEDICAL HISTORY: Past Medical History:  Diagnosis Date   Acne    ADHD (attention deficit hyperactivity disorder)    Anxiety    Autism    Mental retardation    Seasonal allergies    Seizures (HCC)    Vitamin D deficiency     MEDICATIONS: Current Outpatient Medications on File Prior to Visit  Medication Sig Dispense Refill   atomoxetine (STRATTERA) 40 MG capsule Take 40 mg by mouth at bedtime.     atomoxetine (STRATTERA) 60 MG capsule Take 60 mg by mouth every morning.     carbamide  peroxide (DEBROX) 6.5 % otic solution Place 5 drops into both ears 2 (two) times a week. For cerumen impaction     divalproex  (DEPAKOTE ) 250 MG DR tablet Take 1 tab in AM, 2 tabs in PM 270 tablet 3   docusate sodium (COLACE) 100 MG capsule Take 100 mg by mouth daily.     Emollient (EUCERIN) lotion Apply 1 Application topically as needed for dry skin.     feeding supplement (BOOST HIGH PROTEIN) LIQD Take 1 Container by mouth 3 (three) times daily between meals.     LORazepam  (ATIVAN ) 0.5 MG tablet Take 0.5 mg by mouth 4 (four) times daily.     nystatin (MYCOSTATIN/NYSTOP) powder Apply 1  Application topically in the morning, at noon, in the evening, and at bedtime.     PARoxetine (PAXIL) 20 MG tablet Take 20 mg by mouth at bedtime.     No current facility-administered medications on file prior to visit.    ALLERGIES: No Known Allergies  FAMILY HISTORY: History reviewed. No pertinent family history.  SOCIAL HISTORY: Social History   Socioeconomic History   Marital status: Single    Spouse name: Not on file   Number of children: Not on file   Years of education: Not on file   Highest education level: Not on file  Occupational History   Not on file  Tobacco Use   Smoking status: Never   Smokeless tobacco: Never  Vaping Use   Vaping status: Never Used  Substance and Sexual Activity   Alcohol use: No    Alcohol/week: 0.0 standard drinks of alcohol   Drug use: No   Sexual activity: Not on file  Other Topics Concern   Not on file  Social History Narrative   Group home, [place]   Social Drivers of Health   Financial Resource Strain: Low Risk  (08/26/2023)   Received from [facility]   Overall Financial Resource Strain (CARDIA)    Difficulty of Paying Living Expenses: Not hard at all  Food Insecurity: No Food Insecurity (08/26/2023)   Received from [facility]   Hunger Vital Sign    Worried About Running Out of Food in the Last Year: Never true    Ran Out of Food in the Last Year: Never true  Transportation Needs: No Transportation Needs (08/26/2023)   Received from [facility]    Lack of Transportation (Medical): No    Lack of Transportation (Non-Medical): No  Physical Activity: Not on file  Stress: Not on file  Social Connections: Patient Unable To Answer (08/26/2023)   Received from [facility]   Social Network    How would you rate your social network (family, work, friends)?: Patient unable to answer  Intimate Partner Violence: Patient Unable To Answer (08/26/2023)   Received from [facility]   HITS     Over the last 12 months how often did your partner physically hurt you?: Patient unable to answer    Over the last 12 months how often did your partner insult you or talk down to you?: Patient unable to answer    Over the last 12 months how often did your partner threaten you with physical harm?: Patient unable to answer    Over the last 12 months how often did your partner scream or curse at you?: Patient unable to answer     PHYSICAL EXAM: Patient refused vital signs. General: No acute distress Head:  Normocephalic/atraumatic Skin/Extremities: No rash, no edema  Neurological Exam: alert and awake. He is flapping hands together throughout the visit. He is non-verbal and does not follow instructions except to stand up and walk. He did not follow instructions to sit back on chair. He reached for object with right hand. Cranial nerves: Pupils equal, round. No facial asymmetry.  Motor: moves all extremities symmtrically x 4 at least anti-gravaity. Gait narrow-based and steady, no ataxia. No tremors.    IMPRESSION: This is a 43 yo man with a history of static encephalopathy, non-verbal, does not follow commands at baseline, who had a witnessed convulsion last 04/25/15. On review of records, he had an ER visit in 2008 for questionable seizure with forehead laceration. Head CT unremarkable. He had an MRI at that time that was degraded by motion but overall no significant abnormalities. He is easily agitated by touch and will not tolerate an EEG. He has been seizure-free since August 2016 on Depakote  250mg  in AM, 500mg  in PM. Continue 24/7 care. Follow-up in 1 year, call for any changes.    Thank you for allowing me to participate in his care.  Please do not hesitate to call for any questions or concerns.    Rayfield Cairo, M.D.   CC: Dr. Charmel Cooter

## 2024-01-28 NOTE — Patient Instructions (Signed)
Good to see you doing well. Continue all your medications. Follow-up in 1 year, call for any changes.    Seizure Precautions: 1. If medication has been prescribed for you to prevent seizures, take it exactly as directed.  Do not stop taking the medicine without talking to your doctor first, even if you have not had a seizure in a long time.   2. Avoid activities in which a seizure would cause danger to yourself or to others.  Don't operate dangerous machinery, swim alone, or climb in high or dangerous places, such as on ladders, roofs, or girders.  Do not drive unless your doctor says you may.  3. If you have any warning that you may have a seizure, lay down in a safe place where you can't hurt yourself.    4.  No driving for 6 months from last seizure, as per [facility].   Please refer to the following link on the [employer]'s website for more information: http://www.epilepsyfoundation.org/answerplace/Social/driving/drivingu.cfm   5.  Maintain good sleep hygiene.  6.  Contact your doctor if you have any problems that may be related to the medicine you are taking.  7.  Call 911 and bring the patient back to the ED if:        A.  The seizure lasts longer than 5 minutes.       B.  The patient doesn't awaken shortly after the seizure  C.  The patient has new problems such as difficulty seeing, speaking or moving  D.  The patient was injured during the seizure  E.  The patient has a temperature over 102 F (39C)  F.  The patient vomited and now is having trouble breathing

## 2024-08-07 ENCOUNTER — Telehealth: Payer: Self-pay | Admitting: Neurology

## 2024-08-07 DIAGNOSIS — G40309 Generalized idiopathic epilepsy and epileptic syndromes, not intractable, without status epilepticus: Secondary | ICD-10-CM

## 2024-08-07 NOTE — Telephone Encounter (Signed)
 Claudia from [facility] called this afternoon. Will stated that they sent in  a request for the Generic Depakote , and they have not gotten a responses yet from office. Please them a [phone]. Thanks

## 2024-08-08 MED ORDER — DIVALPROEX SODIUM 250 MG PO DR TAB: DELAYED_RELEASE_TABLET | ORAL | 4 refills | Status: AC

## 2024-08-08 NOTE — Telephone Encounter (Signed)
 He is on generic. It looks like this is a different pharmacy from where his last Rx was sent. Pls confirm address/details of pharmacy and we can send refills there, thanks

## 2024-08-08 NOTE — Telephone Encounter (Signed)
Refills have been sent to new pharmacy.

## 2024-08-08 NOTE — Addendum Note (Signed)
 Addended by: TAFT MOATS on: 08/08/2024 10:58 AM   Modules accepted: Orders

## 2024-08-14 ENCOUNTER — Encounter: Payer: Self-pay | Admitting: Neurology

## 2025-01-29 ENCOUNTER — Ambulatory Visit: Payer: MEDICAID | Admitting: Neurology
